# Patient Record
Sex: Female | Born: 1985 | Race: White | Hispanic: No | Smoking: Current every day smoker
Health system: Southern US, Community
[De-identification: ages and names within clinical notes are randomized; demographics above are authoritative.]

## PROBLEM LIST (undated history)

## (undated) DIAGNOSIS — Q519 Congenital malformation of uterus and cervix, unspecified: Secondary | ICD-10-CM

## (undated) DIAGNOSIS — J302 Other seasonal allergic rhinitis: Secondary | ICD-10-CM

## (undated) DIAGNOSIS — K219 Gastro-esophageal reflux disease without esophagitis: Secondary | ICD-10-CM

## (undated) DIAGNOSIS — D497 Neoplasm of unspecified behavior of endocrine glands and other parts of nervous system: Secondary | ICD-10-CM

## (undated) HISTORY — PX: HYSTEROSCOPY WITH D & C: SHX1775

## (undated) HISTORY — PX: BRAIN SURGERY: SHX531

## (undated) HISTORY — PX: WISDOM TOOTH EXTRACTION: SHX21

## (undated) HISTORY — DX: Neoplasm of unspecified behavior of endocrine glands and other parts of nervous system: D49.7

## (undated) HISTORY — PX: HYSTEROSCOPY: SHX211

---
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
## 2004-02-28 ENCOUNTER — Inpatient Hospital Stay (HOSPITAL_COMMUNITY): Admission: AD | Admit: 2004-02-28 | Discharge: 2004-02-28 | Payer: Self-pay | Admitting: Gynecology

## 2005-07-21 ENCOUNTER — Other Ambulatory Visit: Admission: RE | Admit: 2005-07-21 | Discharge: 2005-07-21 | Payer: Self-pay | Admitting: Obstetrics and Gynecology

## 2005-12-05 ENCOUNTER — Inpatient Hospital Stay (HOSPITAL_COMMUNITY): Admission: AD | Admit: 2005-12-05 | Discharge: 2005-12-05 | Payer: Self-pay | Admitting: Obstetrics and Gynecology

## 2005-12-06 ENCOUNTER — Inpatient Hospital Stay (HOSPITAL_COMMUNITY): Admission: AD | Admit: 2005-12-06 | Discharge: 2005-12-06 | Payer: Self-pay | Admitting: Obstetrics and Gynecology

## 2005-12-23 ENCOUNTER — Inpatient Hospital Stay (HOSPITAL_COMMUNITY): Admission: AD | Admit: 2005-12-23 | Discharge: 2005-12-23 | Payer: Self-pay | Admitting: Obstetrics and Gynecology

## 2005-12-25 ENCOUNTER — Encounter: Admission: RE | Admit: 2005-12-25 | Discharge: 2005-12-25 | Payer: Self-pay | Admitting: Obstetrics and Gynecology

## 2006-01-08 ENCOUNTER — Inpatient Hospital Stay (HOSPITAL_COMMUNITY): Admission: AD | Admit: 2006-01-08 | Discharge: 2006-01-08 | Payer: Self-pay | Admitting: Obstetrics and Gynecology

## 2006-01-22 ENCOUNTER — Inpatient Hospital Stay (HOSPITAL_COMMUNITY): Admission: AD | Admit: 2006-01-22 | Discharge: 2006-01-24 | Payer: Self-pay | Admitting: Obstetrics and Gynecology

## 2013-05-19 ENCOUNTER — Other Ambulatory Visit: Payer: Self-pay | Admitting: Obstetrics and Gynecology

## 2013-05-19 DIAGNOSIS — Q51810 Arcuate uterus: Secondary | ICD-10-CM

## 2013-05-22 ENCOUNTER — Other Ambulatory Visit: Payer: Self-pay

## 2013-05-25 ENCOUNTER — Other Ambulatory Visit: Payer: Self-pay

## 2013-05-29 ENCOUNTER — Ambulatory Visit
Admission: RE | Admit: 2013-05-29 | Discharge: 2013-05-29 | Disposition: A | Discharge status: Home / Self Care | Payer: BC Managed Care – PPO | Source: Ambulatory Visit | Attending: Obstetrics and Gynecology | Admitting: Obstetrics and Gynecology

## 2013-05-29 ENCOUNTER — Other Ambulatory Visit: Payer: Self-pay

## 2013-05-29 DIAGNOSIS — Q51810 Arcuate uterus: Secondary | ICD-10-CM

## 2013-05-29 MED ORDER — GADOBENATE DIMEGLUMINE 529 MG/ML IV SOLN
15.0000 mL | Freq: Once | INTRAVENOUS | Status: AC | PRN
  Administered 2013-05-29: 15 mL via INTRAVENOUS

## 2013-06-22 ENCOUNTER — Other Ambulatory Visit: Payer: Self-pay

## 2013-08-27 ENCOUNTER — Encounter (HOSPITAL_BASED_OUTPATIENT_CLINIC_OR_DEPARTMENT_OTHER): Payer: Self-pay | Admitting: *Deleted

## 2013-08-28 ENCOUNTER — Encounter (HOSPITAL_BASED_OUTPATIENT_CLINIC_OR_DEPARTMENT_OTHER): Payer: Self-pay | Admitting: *Deleted

## 2013-08-28 NOTE — Progress Notes (Signed)
NPO AFTER MN. ARRIVE AT 0915.  NEEDS HG AND URINE PREG. WILL TAKE ZANTAC AM DOS W/ SIPS OF WATER.

## 2013-09-03 ENCOUNTER — Ambulatory Visit (HOSPITAL_BASED_OUTPATIENT_CLINIC_OR_DEPARTMENT_OTHER)
Admission: RE | Admit: 2013-09-03 | Discharge: 2013-09-03 | Disposition: A | Discharge status: Home / Self Care | Payer: BC Managed Care – PPO | Source: Ambulatory Visit | Attending: Obstetrics and Gynecology | Admitting: Obstetrics and Gynecology

## 2013-09-03 ENCOUNTER — Encounter (HOSPITAL_BASED_OUTPATIENT_CLINIC_OR_DEPARTMENT_OTHER): Payer: Self-pay

## 2013-09-03 ENCOUNTER — Encounter (HOSPITAL_BASED_OUTPATIENT_CLINIC_OR_DEPARTMENT_OTHER)
Admission: RE | Disposition: A | Discharge status: Home / Self Care | Payer: Self-pay | Source: Ambulatory Visit | Attending: Obstetrics and Gynecology

## 2013-09-03 ENCOUNTER — Ambulatory Visit (HOSPITAL_BASED_OUTPATIENT_CLINIC_OR_DEPARTMENT_OTHER): Payer: BC Managed Care – PPO | Admitting: Anesthesiology

## 2013-09-03 ENCOUNTER — Encounter (HOSPITAL_BASED_OUTPATIENT_CLINIC_OR_DEPARTMENT_OTHER): Payer: BC Managed Care – PPO | Admitting: Anesthesiology

## 2013-09-03 DIAGNOSIS — Q512 Other doubling of uterus, unspecified: Principal | ICD-10-CM

## 2013-09-03 DIAGNOSIS — Q5128 Other doubling of uterus, other specified: Secondary | ICD-10-CM | POA: Insufficient documentation

## 2013-09-03 DIAGNOSIS — K219 Gastro-esophageal reflux disease without esophagitis: Secondary | ICD-10-CM | POA: Insufficient documentation

## 2013-09-03 DIAGNOSIS — F17200 Nicotine dependence, unspecified, uncomplicated: Secondary | ICD-10-CM | POA: Insufficient documentation

## 2013-09-03 HISTORY — PX: HYSTEROSCOPY: SHX211

## 2013-09-03 HISTORY — DX: Congenital malformation of uterus and cervix, unspecified: Q51.9

## 2013-09-03 HISTORY — DX: Gastro-esophageal reflux disease without esophagitis: K21.9

## 2013-09-03 HISTORY — DX: Other seasonal allergic rhinitis: J30.2

## 2013-09-03 LAB — POCT PREGNANCY, URINE: Preg Test, Ur: NEGATIVE

## 2013-09-03 LAB — POCT HEMOGLOBIN-HEMACUE: Hemoglobin: 14.2 g/dL (ref 12.0–15.0)

## 2013-09-03 SURGERY — HYSTEROSCOPY
Anesthesia: General | Site: Vagina

## 2013-09-03 MED ORDER — ONDANSETRON HCL 4 MG/2ML IJ SOLN
INTRAMUSCULAR | Status: DC | PRN
  Administered 2013-09-03: 4 mg via INTRAVENOUS

## 2013-09-03 MED ORDER — FENTANYL CITRATE 0.05 MG/ML IJ SOLN
100.0000 ug | Freq: Once | INTRAMUSCULAR | Status: AC
  Administered 2013-09-03 (×2): 50 ug via INTRAVENOUS
  Filled 2013-09-03: qty 2

## 2013-09-03 MED ORDER — GLYCOPYRROLATE 0.2 MG/ML IJ SOLN
INTRAMUSCULAR | Status: DC | PRN
  Administered 2013-09-03: 0.2 mg via INTRAVENOUS

## 2013-09-03 MED ORDER — MIDAZOLAM HCL 5 MG/5ML IJ SOLN
INTRAMUSCULAR | Status: DC | PRN
  Administered 2013-09-03 (×2): 1 mg via INTRAVENOUS

## 2013-09-03 MED ORDER — SODIUM CHLORIDE 0.9 % IV SOLN
INTRAVENOUS | Status: DC | PRN
  Administered 2013-09-03: 13:00:00 via INTRAMUSCULAR

## 2013-09-03 MED ORDER — LACTATED RINGERS IV SOLN
INTRAVENOUS | Status: DC | PRN
  Administered 2013-09-03 (×2): via INTRAVENOUS

## 2013-09-03 MED ORDER — FENTANYL CITRATE 0.05 MG/ML IJ SOLN
25.0000 ug | INTRAMUSCULAR | Status: DC | PRN
  Filled 2013-09-03: qty 1

## 2013-09-03 MED ORDER — ONDANSETRON HCL 4 MG/2ML IJ SOLN: INTRAMUSCULAR | Status: DC | PRN

## 2013-09-03 MED ORDER — ACETAMINOPHEN 10 MG/ML IV SOLN
INTRAVENOUS | Status: DC | PRN
  Administered 2013-09-03: 1000 mg via INTRAVENOUS

## 2013-09-03 MED ORDER — KETOROLAC TROMETHAMINE 30 MG/ML IJ SOLN
INTRAMUSCULAR | Status: DC | PRN
  Administered 2013-09-03: 30 mg via INTRAVENOUS

## 2013-09-03 MED ORDER — CEFAZOLIN SODIUM-DEXTROSE 2-3 GM-% IV SOLR
2.0000 g | Freq: Once | INTRAVENOUS | Status: AC
  Administered 2013-09-03: 2 g via INTRAVENOUS
  Filled 2013-09-03: qty 50

## 2013-09-03 MED ORDER — FENTANYL CITRATE 0.05 MG/ML IJ SOLN
INTRAMUSCULAR | Status: AC
  Filled 2013-09-03: qty 4

## 2013-09-03 MED ORDER — DEXAMETHASONE SODIUM PHOSPHATE 4 MG/ML IJ SOLN
INTRAMUSCULAR | Status: DC | PRN
  Administered 2013-09-03: 10 mg via INTRAVENOUS

## 2013-09-03 MED ORDER — METOCLOPRAMIDE HCL 5 MG/ML IJ SOLN
INTRAMUSCULAR | Status: DC | PRN
  Administered 2013-09-03: 10 mg via INTRAVENOUS

## 2013-09-03 MED ORDER — LACTATED RINGERS IV SOLN
INTRAVENOUS | Status: DC
  Administered 2013-09-03 (×2): via INTRAVENOUS
  Filled 2013-09-03: qty 1000

## 2013-09-03 MED ORDER — LIDOCAINE HCL (CARDIAC) 20 MG/ML IV SOLN
INTRAVENOUS | Status: DC | PRN
  Administered 2013-09-03: 60 mg via INTRAVENOUS

## 2013-09-03 MED ORDER — LACTATED RINGERS IV SOLN
INTRAVENOUS | Status: DC
  Filled 2013-09-03: qty 1000

## 2013-09-03 MED ORDER — FENTANYL CITRATE 0.05 MG/ML IJ SOLN
INTRAMUSCULAR | Status: DC | PRN
  Administered 2013-09-03: 12.5 ug via INTRAVENOUS
  Administered 2013-09-03: 25 ug via INTRAVENOUS
  Administered 2013-09-03: 12.5 ug via INTRAVENOUS
  Administered 2013-09-03: 25 ug via INTRAVENOUS
  Administered 2013-09-03: 12.5 ug via INTRAVENOUS
  Administered 2013-09-03 (×2): 25 ug via INTRAVENOUS
  Administered 2013-09-03: 12.5 ug via INTRAVENOUS
  Administered 2013-09-03 (×2): 25 ug via INTRAVENOUS

## 2013-09-03 MED ORDER — HEMOSTATIC AGENTS (NO CHARGE) OPTIME
TOPICAL | Status: DC | PRN
  Administered 2013-09-03: 1 via TOPICAL

## 2013-09-03 MED ORDER — MIDAZOLAM HCL 2 MG/2ML IJ SOLN
INTRAMUSCULAR | Status: AC
  Filled 2013-09-03: qty 2

## 2013-09-03 MED ORDER — FENTANYL CITRATE 0.05 MG/ML IJ SOLN
INTRAMUSCULAR | Status: AC
  Filled 2013-09-03: qty 2

## 2013-09-03 MED ORDER — PROPOFOL 10 MG/ML IV BOLUS
INTRAVENOUS | Status: DC | PRN
Start: 2013-09-03 — End: 2013-09-03
  Administered 2013-09-03: 200 mg via INTRAVENOUS

## 2013-09-03 MED ORDER — SODIUM CHLORIDE 0.9 % IR SOLN
Status: DC | PRN
Start: 2013-09-03 — End: 2013-09-03
  Administered 2013-09-03: 3000 mL

## 2013-09-03 SURGICAL SUPPLY — 50 items
CANISTER SUCTION 2500CC (MISCELLANEOUS) ×3 IMPLANT
CANNULA CURETTE W/SYR 6 (CANNULA) ×1 IMPLANT
CANNULA CURETTE W/SYR 6MM (CANNULA) ×1
CATH ROBINSON RED A/P 16FR (CATHETERS) ×1 IMPLANT
CORD ACTIVE DISPOSABLE (ELECTRODE)
CORD ELECTRO ACTIVE DISP (ELECTRODE) IMPLANT
COVER TABLE BACK 60X90 (DRAPES) ×3 IMPLANT
DRAPE CAMERA CLOSED 9X96 (DRAPES) ×3 IMPLANT
DRAPE HYSTEROSCOPY (DRAPE) ×3 IMPLANT
DRAPE LG THREE QUARTER DISP (DRAPES) ×6 IMPLANT
DRESSING TELFA 8X3 (GAUZE/BANDAGES/DRESSINGS) ×3 IMPLANT
ELECT LOOP GYNE PRO 24FR (CUTTING LOOP)
ELECT REM PT RETURN 9FT ADLT (ELECTROSURGICAL)
ELECT VAPORTRODE GRVD BAR (ELECTRODE) IMPLANT
ELECTRODE LOOP GYNE PRO 24FR (CUTTING LOOP) IMPLANT
ELECTRODE REM PT RTRN 9FT ADLT (ELECTROSURGICAL) IMPLANT
ELECTRODE ROLLER VERSAPOINT (ELECTRODE) IMPLANT
ELECTRODE RT ANGLE VERSAPOINT (CUTTING LOOP) IMPLANT
GLOVE BIO SURGEON STRL SZ 6.5 (GLOVE) ×1 IMPLANT
GLOVE BIO SURGEON STRL SZ8 (GLOVE) ×3 IMPLANT
GLOVE BIO SURGEONS STRL SZ 6.5 (GLOVE) ×1
GLOVE BIOGEL PI IND STRL 6.5 (GLOVE) IMPLANT
GLOVE BIOGEL PI IND STRL 8.5 (GLOVE) ×1 IMPLANT
GLOVE BIOGEL PI INDICATOR 6.5 (GLOVE) ×2
GLOVE BIOGEL PI INDICATOR 8.5 (GLOVE) ×2
GOWN STRL REIN XL XLG (GOWN DISPOSABLE) ×2 IMPLANT
GOWN STRL REUS W/TWL LRG LVL3 (GOWN DISPOSABLE) ×2 IMPLANT
GOWN STRL REUS W/TWL XL LVL3 (GOWN DISPOSABLE) ×2 IMPLANT
IV NS IRRIG 3000ML ARTHROMATIC (IV SOLUTION) ×2 IMPLANT
LEGGING LITHOTOMY PAIR STRL (DRAPES) ×3 IMPLANT
LOOP ANGLED CUTTING 22FR (CUTTING LOOP) IMPLANT
NDL SPNL 22GX3.5 QUINCKE BK (NEEDLE) ×1 IMPLANT
NEEDLE SPNL 22GX3.5 QUINCKE BK (NEEDLE) ×3 IMPLANT
PACK BASIN DAY SURGERY FS (CUSTOM PROCEDURE TRAY) ×3 IMPLANT
PAD OB MATERNITY 4.3X12.25 (PERSONAL CARE ITEMS) ×3 IMPLANT
SET IRRIG Y TYPE TUR BLADDER L (SET/KITS/TRAYS/PACK) IMPLANT
SET TUBING HYSTEROSCOPY 2 NDL (TUBING) ×3 IMPLANT
STENT BALLN UTERINE 3CM 6FR (Stent) IMPLANT
STENT BALLN UTERINE 4CM 6FR (STENTS) IMPLANT
SUT MON AB 3-0 SH 27 (SUTURE) ×2
SUT MON AB 3-0 SH27 (SUTURE) IMPLANT
SUT SILK 2 0 SH (SUTURE) IMPLANT
SUT SILK 3 0 PS 1 (SUTURE) IMPLANT
SYR 20CC LL (SYRINGE) IMPLANT
SYR 3ML 18GX1 1/2 (SYRINGE) ×3 IMPLANT
SYR CONTROL 10ML LL (SYRINGE) ×3 IMPLANT
TOWEL OR 17X24 6PK STRL BLUE (TOWEL DISPOSABLE) ×6 IMPLANT
TRAY DSU PREP LF (CUSTOM PROCEDURE TRAY) ×3 IMPLANT
TUBE HYSTEROSCOPY W Y-CONNECT (TUBING) ×3 IMPLANT
WATER STERILE IRR 500ML POUR (IV SOLUTION) ×3 IMPLANT

## 2013-09-03 NOTE — Op Note (Signed)
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 The uterus sounded to 7.5 cm. it had a partial septum extending down 1.5 cm. No submucosal myoma was noted.  Both tubal ostia were seen. There was a membraneous cover with a central opening caudal to the left tubal ostium. Description of procedure: Patient was placed in dorsal supine position. General anesthesia was administered. She was placed in lithotomy position. She was prepped and draped in sterile manner. A vaginal speculum was placed. A dilute vasopressin solution containing 0.33 units per milliliter was injected into the cervical stroma x5 cc. A Slimline hysteroscope with 15 lens was inserted into the canal and above findings were noted. Distention medium was normal saline. Distention method was a hysteroscopic pump set at 80 mm mercury. Above findings were noted.  Using hysteroscopic scissors the septum was incised up to the level of an imaginary line drawn, connecting the 2 tubal ostia. Using a sharp curet endometrium was curetted and the specimen was sent to pathology. As an adhesion barrier, a large sheet of Seprafilm was rolled around smooth DeBakey forceps. The cervix was dilated to 64 [place] with [employer] dilators. The Seprafilm roll was inserted up to the fundus and was trimmed off at 7.5 cm. It was temporarily affixed to the ectocervical mucosa with a single suture of 4-0 Monocryl.  Hemostasis was insured. Instrument count was correct. Estimated blood loss was less than 20 mL. The patient tolerated the procedure  well and was transferred to recovery in satisfactory condition.  Governor Specking

## 2013-09-03 NOTE — Anesthesia Preprocedure Evaluation (Addendum)
Anesthesia Evaluation  Patient identified by MRN, date of birth, ID band Patient awake    Reviewed: Allergy & Precautions, H&P , NPO status , Patient's Chart, lab work & pertinent test results  Airway Mallampati: II TM Distance: >3 FB Neck ROM: full    Dental no notable dental hx. (+) Teeth Intact, Dental Advisory Given   Pulmonary neg pulmonary ROS, Current Smoker,  breath sounds clear to auscultation  Pulmonary exam normal       Cardiovascular Exercise Tolerance: Good negative cardio ROS  Rhythm:regular Rate:Normal     Neuro/Psych negative neurological ROS  negative psych ROS   GI/Hepatic negative GI ROS, Neg liver ROS, GERD-  Medicated and Controlled,  Endo/Other  negative endocrine ROS  Renal/GU negative Renal ROS  negative genitourinary   Musculoskeletal   Abdominal   Peds  Hematology negative hematology ROS (+)   Anesthesia Other Findings   Reproductive/Obstetrics negative OB ROS                          Anesthesia Physical Anesthesia Plan  ASA: II  Anesthesia Plan: General   Post-op Pain Management:    Induction: Intravenous  Airway Management Planned: LMA  Additional Equipment:   Intra-op Plan:   Post-operative Plan:   Informed Consent: I have reviewed the patients History and Physical, chart, labs and discussed the procedure including the risks, benefits and alternatives for the proposed anesthesia with the patient or authorized representative who has indicated his/her understanding and acceptance.   Dental Advisory Given  Plan Discussed with: CRNA and Surgeon  Anesthesia Plan Comments:         Anesthesia Quick Evaluation

## 2013-09-03 NOTE — Discharge Instructions (Addendum)
°  Post Anesthesia Home Care Instructions ° °Activity: °Get plenty of rest for the remainder of the day. A responsible adult should stay with you for 24 hours following the procedure.  °For the next 24 hours, DO NOT: °-Drive a car °-Operate machinery °-Drink alcoholic beverages °-Take any medication unless instructed by your physician °-Make any legal decisions or sign important papers. ° °Meals: °Start with liquid foods such as gelatin or soup. Progress to regular foods as tolerated. Avoid greasy, spicy, heavy foods. If nausea and/or vomiting occur, drink only clear liquids until the nausea and/or vomiting subsides. Call your physician if vomiting continues. ° °Special Instructions/Symptoms: °Your throat may feel dry or sore from the anesthesia or the breathing tube placed in your throat during surgery. If this causes discomfort, gargle with warm salt water. The discomfort should disappear within 24 hours. ° ° Home care Instructions: ° ° °Personal hygiene:  Used sanitary napkins for vaginal drainage not tampons. Shower or tub bathe the day after your procedure. No douching until bleeding stops. Always wipe from front to back after  Elimination. ° °Activity: Do not drive or operate any equipment today. The effects of the anesthesia are still present and drowsiness may result. Rest today, not necessarily flat bed rest, just take it easy. You may resume your normal activity in one to 2 days. ° °Sexual activity: No intercourse for one week or as indicated by your physician ° °Diet: Eat a light diet as desired this evening. You may resume a regular diet tomorrow. ° °Return to work: One to 2 days. ° °General Expectations of your surgery: Vaginal bleeding should be no heavier than a normal period. Spotting may continue up to 10 days. Mild cramps may continue for a couple of days. You may have a regular period in 2-6 weeks. ° °Unexpected observations call your doctor if these occur: persistent or heavy bleeding. Severe  abdominal cramping or pain. Elevation of temperature greater than 100°F. ° °Call for an appointment in one week. ° ° ° °Patient's Signature_______________________________________________________ ° °Nurse's Signature________________________________________________________ °

## 2013-09-03 NOTE — Anesthesia Procedure Notes (Signed)
Procedure Name: LMA Insertion Date/Time: 09/03/2013 1:04 PM Performed by: Justice Rocher Pre-anesthesia Checklist: Patient identified, Emergency Drugs available, Suction available and Patient being monitored Patient Re-evaluated:Patient Re-evaluated prior to inductionOxygen Delivery Method: Circle System Utilized Preoxygenation: Pre-oxygenation with 100% oxygen Intubation Type: IV induction Ventilation: Mask ventilation without difficulty LMA: LMA inserted LMA Size: 4.0 Number of attempts: 1 Airway Equipment and Method: bite block Placement Confirmation: positive ETCO2 Tube secured with: Tape Dental Injury: Teeth and Oropharynx as per pre-operative assessment

## 2013-09-03 NOTE — Transfer of Care (Signed)
Immediate Anesthesia Transfer of Care Note  Patient: Sierra Stewart  Procedure(s) Performed: Procedure(s) (LRB): HYSTEROSCOPY, EXCISION OF UTRINE SEPTUM, suction d&c, stent placement with septrafilm (N/A)  Patient Location: PACU  Anesthesia Type: General  Level of Consciousness: awake, sedated, patient cooperative and responds to stimulation  Airway & Oxygen Therapy: Patient Spontanous Breathing and Patient connected to face mask oxygen  Post-op Assessment: Report given to PACU RN, Post -op Vital signs reviewed and stable and Patient moving all extremities  Post vital signs: Reviewed and stable  Complications: No apparent anesthesia complications

## 2013-09-03 NOTE — H&P (Signed)
Sierra Stewart is a 28 y.o. female G:2 P:1011, originally referred to me by Dr. Charlesetta Garibaldi, for partial uterine septum and small submucosal myoma.  Patient would like to preserve her childbearing potential.  Pertinent Gynecological History: Menses: flow is excessive with use of 3 pads or tampons on heaviest days Contraception: none DES exposure: denies Blood transfusions: none Sexually transmitted diseases: no past history  Last mammogram: normal Last pap: normal  OB History: ! SVD and 1 EAb   Menstrual History: Menarche age: 84 No LMP recorded.    Past Medical History  Diagnosis Date  . Uterine anomaly     SEPTUM  . GERD (gastroesophageal reflux disease)   . Seasonal allergies                     Past Surgical History  Procedure Laterality Date  . Hysteroscopy w/d&c  AGE 57  . Wisdom tooth extraction  AGE 32             History reviewed. No pertinent family history. No hereditary disease.  No cancer of breast, ovary, uterus. No cutaneous leiomyomatosis or renal cell carcinoma.  History   Social History  . Marital Status: Single    Spouse Name: N/A    Number of Children: N/A  . Years of Education: N/A   Occupational History  . Not on file.   Social History Main Topics  . Smoking status: Current Every Day Smoker -- 1.00 packs/day for 15 years    Types: Cigarettes  . Smokeless tobacco: Never Used  . Alcohol Use: 2.4 oz/week    4 Cans of beer per week  . Drug Use: No  . Sexual Activity: Not on file   Other Topics Concern  . Not on file   Social History Narrative  . No narrative on file    Allergies  Allergen Reactions  . Imitrex [Sumatriptan] Shortness Of Breath and Swelling    No current facility-administered medications on file prior to encounter.   No current outpatient prescriptions on file prior to encounter.     Review of Systems  Constitutional: Negative.   HENT: Negative.   Eyes: Negative.   Respiratory: Negative.   Cardiovascular:  Negative.   Gastrointestinal: Negative.   Genitourinary: Negative.   Musculoskeletal: Negative.   Skin: Negative.   Neurological: Negative.   Endo/Heme/Allergies: Negative.   Psychiatric/Behavioral: Negative.      Physical Exam  BP 114/83  Pulse 97  Temp(Src) 97.2 F (36.2 C) (Oral)  Resp 16  Ht 5\' 2"  (1.575 m)  Wt 70.761 kg (156 lb)  BMI 28.53 kg/m2  SpO2 100%  LMP 08/21/2013 Constitutional: She is oriented to person, place, and time. She appears well-developed and well-nourished.  HENT:  Head: Normocephalic and atraumatic.  Nose: Nose normal.  Mouth/Throat: Oropharynx is clear and moist. No oropharyngeal exudate.  Eyes: Conjunctivae normal and EOM are normal. Pupils are equal, round, and reactive to light. No scleral icterus.  Neck: Normal range of motion. Neck supple. No tracheal deviation present. No thyromegaly present.  Cardiovascular: Normal rate.   Respiratory: Effort normal and breath sounds normal.  GI: Soft. Bowel sounds are normal. She exhibits no distension and no mass. There is no tenderness.  Lymphadenopathy:    She has no cervical adenopathy.  Neurological: She is alert and oriented to person, place, and time. She has normal reflexes.  Skin: Skin is warm.  Psychiatric: She has a normal mood and affect. Her behavior is normal. Judgment and thought  content normal.       Assessment/Plan:  Partial uterine septum Submucosal myoma Pt is scheduled for H/S , inc of septum, resection of myoma. Benefits and risks were discussed with pt.    Governor Specking

## 2013-09-03 NOTE — Anesthesia Postprocedure Evaluation (Signed)
  Anesthesia Post-op Note  Patient: Sierra Stewart  Procedure(s) Performed: Procedure(s) (LRB): HYSTEROSCOPY, EXCISION OF UTRINE SEPTUM, suction d&c, stent placement with septrafilm (N/A)  Patient Location: PACU  Anesthesia Type: General  Level of Consciousness: awake and alert   Airway and Oxygen Therapy: Patient Spontanous Breathing  Post-op Pain: mild  Post-op Assessment: Post-op Vital signs reviewed, Patient's Cardiovascular Status Stable, Respiratory Function Stable, Patent Airway and No signs of Nausea or vomiting  Last Vitals:  Filed Vitals:   09/03/13 1351  BP: 107/75  Pulse: 86  Temp:   Resp: 12    Post-op Vital Signs: stable   Complications: No apparent anesthesia complications

## 2013-09-04 ENCOUNTER — Encounter (HOSPITAL_BASED_OUTPATIENT_CLINIC_OR_DEPARTMENT_OTHER): Payer: Self-pay | Admitting: Obstetrics and Gynecology

## 2016-03-27 HISTORY — PX: BRAIN SURGERY: SHX531

## 2016-04-19 ENCOUNTER — Other Ambulatory Visit: Payer: Self-pay | Admitting: Internal Medicine

## 2016-04-19 DIAGNOSIS — H53459 Other localized visual field defect, unspecified eye: Secondary | ICD-10-CM

## 2016-04-22 ENCOUNTER — Ambulatory Visit
Admission: RE | Admit: 2016-04-22 | Discharge: 2016-04-22 | Disposition: A | Discharge status: Home / Self Care | Payer: Self-pay | Source: Ambulatory Visit | Attending: Internal Medicine | Admitting: Internal Medicine

## 2016-04-22 DIAGNOSIS — H53459 Other localized visual field defect, unspecified eye: Secondary | ICD-10-CM

## 2016-04-22 MED ORDER — GADOBENATE DIMEGLUMINE 529 MG/ML IV SOLN
17.0000 mL | Freq: Once | INTRAVENOUS | Status: AC | PRN
  Administered 2016-04-22: 17 mL via INTRAVENOUS

## 2016-05-03 ENCOUNTER — Ambulatory Visit (INDEPENDENT_AMBULATORY_CARE_PROVIDER_SITE_OTHER): Payer: BLUE CROSS/BLUE SHIELD | Admitting: Neurology

## 2016-05-03 ENCOUNTER — Encounter: Payer: Self-pay | Admitting: Neurology

## 2016-05-03 ENCOUNTER — Telehealth: Payer: Self-pay | Admitting: Neurology

## 2016-05-03 VITALS — BP 146/83 | HR 102 | Resp 20 | Ht 63.0 in | Wt 177.0 lb

## 2016-05-03 DIAGNOSIS — G43909 Migraine, unspecified, not intractable, without status migrainosus: Secondary | ICD-10-CM | POA: Insufficient documentation

## 2016-05-03 DIAGNOSIS — G43009 Migraine without aura, not intractable, without status migrainosus: Secondary | ICD-10-CM | POA: Diagnosis not present

## 2016-05-03 DIAGNOSIS — R419 Unspecified symptoms and signs involving cognitive functions and awareness: Secondary | ICD-10-CM

## 2016-05-03 DIAGNOSIS — H539 Unspecified visual disturbance: Secondary | ICD-10-CM | POA: Insufficient documentation

## 2016-05-03 DIAGNOSIS — R55 Syncope and collapse: Secondary | ICD-10-CM | POA: Diagnosis not present

## 2016-05-03 DIAGNOSIS — H53453 Other localized visual field defect, bilateral: Secondary | ICD-10-CM

## 2016-05-03 MED ORDER — NORTRIPTYLINE HCL 10 MG PO CAPS: 10.0000 mg | ORAL_CAPSULE | Freq: Every day | ORAL | 6 refills | Status: DC

## 2016-05-03 NOTE — Telephone Encounter (Signed)
Sierra Stewart, would you get an [facility] form ready fpor this patient please?  Headache, migraine, syncopal episodes, musculoskeletal neck pain: Integrative therapy for acupuncture, massage, PT and boifeedback

## 2016-05-03 NOTE — Patient Instructions (Addendum)
Remember to drink plenty of fluid, eat healthy meals and do not skip any meals. Try to eat protein with a every meal and eat a healthy snack such as fruit or nuts in between meals. Try to keep a regular sleep-wake schedule and try to exercise daily, particularly in the form of walking, 20-30 minutes a day, if you can.   As far as your medications are concerned, I would like to suggest: Nortriptyline at bedtime  As far as diagnostic testing: EEG, Dr Katy Fitch then possible a 3-day home eeg  Our phone number is [phone]. We also have an after hours call service for urgent matters and there is a physician on-call for urgent questions. For any emergencies you know to call 911 or go to the nearest emergency room  Nortriptyline capsules What is this medicine? NORTRIPTYLINE (nor TRIP ti leen) is used to treat migraines. This medicine may be used for other purposes; ask your health care provider or pharmacist if you have questions. COMMON BRAND NAME(S): Aventyl, Pamelor What should I tell my health care provider before I take this medicine? They need to know if you have any of these conditions: -an alcohol problem -bipolar disorder or schizophrenia -difficulty passing urine, prostate trouble -glaucoma -heart disease or recent heart attack -liver disease -over active thyroid -seizures -thoughts or plans of suicide or a previous suicide attempt or family history of suicide attempt -an unusual or allergic reaction to nortriptyline, other medicines, foods, dyes, or preservatives -pregnant or trying to get pregnant -breast-feeding How should I use this medicine? Take this medicine by mouth with a glass of water. Follow the directions on the prescription label. Take your doses at regular intervals. Do not take it more often than directed. Do not stop taking this medicine suddenly except upon the advice of your doctor. Stopping this medicine too quickly may cause serious side effects or your condition  may worsen. A special MedGuide will be given to you by the pharmacist with each prescription and refill. Be sure to read this information carefully each time. Talk to your pediatrician regarding the use of this medicine in children. Special care may be needed. Overdosage: If you think you have taken too much of this medicine contact a poison control center or emergency room at once. NOTE: This medicine is only for you. Do not share this medicine with others. What if I miss a dose? If you miss a dose, take it as soon as you can. If it is almost time for your next dose, take only that dose. Do not take double or extra doses. What may interact with this medicine? Do not take this medicine with any of the following medications: -arsenic trioxide -certain medicines medicines for irregular heart beat -cisapride -halofantrine -linezolid -MAOIs like Carbex, Eldepryl, Marplan, Nardil, and Parnate -methylene blue (injected into a vein) -other medicines for mental depression -phenothiazines like perphenazine, thioridazine and chlorpromazine -pimozide -probucol -procarbazine -sparfloxacin -St. John's Wort -ziprasidone This medicine may also interact with any of the following medications: -atropine and related drugs like hyoscyamine, scopolamine, tolterodine and others -barbiturate medicines for inducing sleep or treating seizures, such as phenobarbital -cimetidine -medicines for diabetes -medicines for seizures like carbamazepine or phenytoin -reserpine -thyroid medicine This list may not describe all possible interactions. Give your health care provider a list of all the medicines, herbs, non-prescription drugs, or dietary supplements you use. Also tell them if you smoke, drink alcohol, or use illegal drugs. Some items may interact with your medicine. What should I  watch for while using this medicine? Tell your doctor if your symptoms do not get better or if they get worse. Visit your doctor  or health care professional for regular checks on your progress. Because it may take several weeks to see the full effects of this medicine, it is important to continue your treatment as prescribed by your doctor. Patients and their families should watch out for new or worsening thoughts of suicide or depression. Also watch out for sudden changes in feelings such as feeling anxious, agitated, panicky, irritable, hostile, aggressive, impulsive, severely restless, overly excited and hyperactive, or not being able to sleep. If this happens, especially at the beginning of treatment or after a change in dose, call your health care professional. Dennis Bast may get drowsy or dizzy. Do not drive, use machinery, or do anything that needs mental alertness until you know how this medicine affects you. Do not stand or sit up quickly, especially if you are an older patient. This reduces the risk of dizzy or fainting spells. Alcohol may interfere with the effect of this medicine. Avoid alcoholic drinks. Do not treat yourself for coughs, colds, or allergies without asking your doctor or health care professional for advice. Some ingredients can increase possible side effects. Your mouth may get dry. Chewing sugarless gum or sucking hard candy, and drinking plenty of water may help. Contact your doctor if the problem does not go away or is severe. This medicine may cause dry eyes and blurred vision. If you wear contact lenses you may feel some discomfort. Lubricating drops may help. See your eye doctor if the problem does not go away or is severe. This medicine can cause constipation. Try to have a bowel movement at least every 2 to 3 days. If you do not have a bowel movement for 3 days, call your doctor or health care professional. This medicine can make you more sensitive to the sun. Keep out of the sun. If you cannot avoid being in the sun, wear protective clothing and use sunscreen. Do not use sun lamps or tanning  beds/booths. What side effects may I notice from receiving this medicine? Side effects that you should report to your doctor or health care professional as soon as possible: -allergic reactions like skin rash, itching or hives, swelling of the face, lips, or tongue -anxious -breathing problems -changes in vision -confusion -elevated mood, decreased need for sleep, racing thoughts, impulsive behavior -eye pain -fast, irregular heartbeat -feeling faint or lightheaded, falls -feeling agitated, angry, or irritable -fever with increased sweating -hallucination, loss of contact with reality -seizures -stiff muscles -suicidal thoughts or other mood changes -tingling, pain, or numbness in the feet or hands -trouble passing urine or change in the amount of urine -trouble sleeping -unusually weak or tired -vomiting -yellowing of the eyes or skin Side effects that usually do not require medical attention (report to your doctor or health care professional if they continue or are bothersome): -change in sex drive or performance -change in appetite or weight -constipation -dizziness -dry mouth -nausea -tired -tremors -upset stomach This list may not describe all possible side effects. Call your doctor for medical advice about side effects. You may report side effects to FDA at 1-800-FDA-1088. Where should I keep my medicine? Keep out of the reach of children. Store at room temperature between 15 and 30 degrees C (59 and 86 degrees F). Keep container tightly closed. Throw away any unused medicine after the expiration date. NOTE: This sheet is a summary. It  may not cover all possible information. If you have questions about this medicine, talk to your doctor, pharmacist, or health care provider.  2017 Elsevier/Gold Standard (2015-08-13 12:53:08)   Vasovagal Syncope, Adult Syncope, which is commonly known as fainting or passing out, is a temporary loss of consciousness. It occurs when the  blood flow to the brain is reduced. Vasovagal syncope, also called neurocardiogenic syncope, is a fainting spell that happens when blood flow to the brain is reduced because of a sudden drop in heart rate and blood pressure. Vasovagal syncope is usually harmless. However, you can get injured if you fall during a fainting spell. What are the causes? This condition is caused by a drop in heart rate and blood pressure, usually in response to a trigger. Many things and situations can trigger an episode, including:  Pain.  Fear.  The sight of blood. This may occur during medical procedures, such as when blood is being drawn from a vein.  Common activities, such as coughing, swallowing, stretching, or going to the bathroom.  Emotional stress.  Being in a confined space.  Prolonged standing, especially in a warm environment.  Lack of sleep or rest.  Not eating for a long time.  Not drinking enough liquids.  Recent illness.  Drinking alcohol.  Taking drugs that affect blood pressure, such as marijuana, cocaine, opiates, or inhalants. What are the signs or symptoms? Before a fainting episode, you may:  Feel dizzy or light-headed.  Become pale.  Sense that you are going to faint.  Feel like the room is spinning.  Only see directly ahead (tunnel vision).  Feel sick to your stomach (nauseous).  See spots.  Slowly lose vision.  Hear ringing in your ears.  Have a headache.  Feel warm and sweaty.  Feel a sensation of pins and needles. During the fainting spell, you may twitch or make jerky movements. Fainting spells usually last no longer than a few minutes before you wake up. If you get up too quickly before your body can recover, you may faint again. How is this diagnosed? This condition is diagnosed based on your symptoms, your medical history, and a physical exam. Tests may be done to rule out other causes of fainting. Tests may include:  Blood tests.  Heart tests,  such as an electrocardiogram (ECG), echocardiogram, or electrophysiology study.  A test to check your response to changes in position (tilt table test). How is this treated? Usually, treatment is not needed for this condition. Your health care provider may suggest ways to help prevent fainting episodes. These may include:  Drinking additional fluids if you are exposed to a trigger.  Sitting or lying down if you notice signs that an episode is coming. If your fainting spells continue, your health care provider may recommend that you:  Take medicines to prevent fainting or to help reduce further episodes of fainting.  Do certain exercises.  Wear compression stockings.  Have surgery to place a pacemaker in your body (rare). Follow these instructions at home:  Learn to identify the signs that an episode is coming.  Sit or lie down at the first sign of a fainting spell. If you sit down, put your head down between your legs. If you lie down, swing your legs up in the air to increase blood flow to the brain.  Avoid hot tubs and saunas.  Avoid standing for a long time. If you have to stand for a long time, try:  Crossing your legs.  Flexing and stretching your leg muscles.  Squatting.  Moving your legs.  Bending over.  Drink enough fluid to keep your urine clear or pale yellow.  Make changes to your diet that your health care provider recommends. You may be told to:  Avoid caffeine.  Eat more salt.  Take over-the-counter and prescription medicines only as told by your health care provider. Contact a health care provider if:  You continue to have fainting spells despite treatment.  You faint more often despite treatment.  You lose consciousness for more than a few minutes.  You faint during or after exercising or after being startled.  You have twitching or jerky movements for longer than a few seconds during a fainting spell.  You have an episode of twitching or  jerky movements without fainting. Get help right away if:  A fainting spell leads to an injury or bleeding.  You have new symptoms that occur with the fainting spells, such as:  Shortness of breath.  Chest pain.  Irregular heartbeat.  You twitch or make jerky movements for more than 5 minutes.  You twitch or make jerky movements during more than one fainting spell. This information is not intended to replace advice given to you by your health care provider. Make sure you discuss any questions you have with your health care provider. Document Released: 02/28/2012 Document Revised: 08/25/2015 Document Reviewed: 01/09/2015 Elsevier Interactive Patient Education  2017 [employer].

## 2016-05-03 NOTE — Progress Notes (Addendum)
[facility]    Provider:  Dr Jaynee Eagles Referring Provider: Osborne Casco Fransico Him, MD Primary Care Physician:  Domenick Gong  CC:  Syncope, headache, visual field defect  HPI:  Sierra Stewart is a 31 y.o. female here as a referral from Dr. Osborne Casco for Syncope and headache and vision changes. Symptoms started 2 years ago, they went to a [employer] truck show and the exhaust bothered her, she started to feel nauseated and lightheaded and she could feel herself start to pass out, her vision was getting grey, she "went out". The next thing that she remembers she was in [place] and she had defecated on herself. She had 2 of these episodes that year. Another episode her heart started racing, her vision started with kaleidoscopes on the left side of her left eye, she can;t focus, lightheaded, world is coming in, hot and she was trying to slow her breathing down and she defecates. She also doesn't think she has had a solid stool in years. Happening more frequently. Recently she has extreme fatigue since last October. Before Christmas she had 2 episodes in one week and she saw an optometrist (an eye doctor for glasses) and they found a field defect on 2 occassions but they were different and she does not know where. Since then she is very fatigued with nausea every day and migraines every week. The last few days have been good. Her headache is a sharp pain on the right side, pounding, in the temporal area. Sometimes it is a brief sharp pain and sometimes it is a dull ache all day and nausea. This headache is different than her migraines that are on the right side, pounding and throbbing with light sensitivity and nausea once a week. No seizure-like activity with the episodes of passing out. No confusion afterwards, she knew where she was unknown how long she has lost consciousness. She has lost consciousness twice with the episodes. 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  She sleeps at  9-10pm and gets 7-8 hours of sleep and sleeps well. Her father had migraines and sister as well. No FHx of seizures. During the episodes she feels like her heart is beating out of her chest. She has decreased peripheral vision continuously.She has neck muscle pain. No other focal neurologic deficits, associated symptoms, inciting events or modifiable factors.    Reviewed notes, labs and imaging from outside physicians, which showed:   MRI brain 04/22/2016: personally reviewed images and agree with the following:  FINDINGS: Brain: Normal appearance of the chiasm and tracts. Optic pathways show no mass or gliosis. No acute or remote infarct or hemorrhage. 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  Vascular: Negative  Skull and upper cervical spine: Negative  Sinuses/Orbits: Negative  IMPRESSION: 1. No explanation for visual field defect. 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  Labs 04/10/2016: CMP nml with bun 8 creatinine 0.7, Cbc normal, TSH 0.53,    Review of Systems: Patient complains of symptoms per HPI as well as the following symptoms: fatigue, blurred vision, memory loss, headache, numbness, weakness, slepiness, joint pain, hearing loss. Pertinent negatives per HPI. All others negative.   Social History   Social History  . Marital status: Married    Spouse name: N/A  . Number of children: N/A  . Years of education: N/A   Occupational History  . dog groomer    Social History Main Topics  . Smoking status: Current Every Day Smoker  Packs/day: 1.00    Years: 15.00    Types: Cigarettes  . Smokeless tobacco: Never Used  . Alcohol use 2.4 oz/week    4 Cans of beer per week  . Drug use: No  . Sexual activity: Not on file   Other Topics Concern  . Not on file   Social History Narrative  . No narrative on file    Family History  Problem Relation Age of Onset  . Migraines Father   . Seizures Neg Hx     Past Medical History:  Diagnosis Date  . GERD  (gastroesophageal reflux disease)   . Seasonal allergies   . Uterine anomaly    SEPTUM    Past Surgical History:  Procedure Laterality Date  . HYSTEROSCOPY N/A 09/03/2013   Procedure: HYSTEROSCOPY, EXCISION OF UTRINE SEPTUM, suction d&c, stent placement with septrafilm;  Surgeon: Governor Specking, MD;  Location: [facility];  Service: Gynecology;  Laterality: N/A;  . HYSTEROSCOPY W/D&C  AGE 35  . WISDOM TOOTH EXTRACTION  AGE 42    Current Outpatient Prescriptions  Medication Sig Dispense Refill  . aspirin-acetaminophen-caffeine (EXCEDRIN MIGRAINE) 250-250-65 MG tablet Take by mouth every 6 (six) hours as needed for headache.    . nortriptyline (PAMELOR) 10 MG capsule Take 1 capsule (10 mg total) by mouth at bedtime. 30 capsule 6   No current facility-administered medications for this visit.     Allergies as of 05/03/2016 - Review Complete 05/03/2016  Allergen Reaction Noted  . Imitrex [sumatriptan] Shortness Of Breath and Swelling 08/28/2013    Vitals: BP (!) 146/83   Pulse (!) 102   Resp 20   Ht 5\' 3"  (1.6 m)   Wt 177 lb (80.3 kg)   BMI 31.35 kg/m  Last Weight:  Wt Readings from Last 1 Encounters:  05/03/16 177 lb (80.3 kg)   Last Height:   Ht Readings from Last 1 Encounters:  05/03/16 5\' 3"  (1.6 m)    Physical exam: Exam: Gen: NAD, conversant, well nourised, obese, well groomed                     CV: RRR, no MRG. No Carotid Bruits. No peripheral edema, warm, nontender Eyes: Conjunctivae clear without exudates or hemorrhage  Neuro: Detailed Neurologic Exam  Speech:    Speech is normal; fluent and spontaneous with normal comprehension.  Cognition:    The patient is oriented to person, place, and time;     recent and remote memory intact;     language fluent;     normal attention, concentration,     fund of knowledge Cranial Nerves:    The pupils are equal, round, and reactive to light. The fundi are normal and spontaneous venous pulsations  are present. Visual fields are full to finger confrontation. Extraocular movements are intact. Trigeminal sensation is intact and the muscles of mastication are normal. The face is symmetric. The palate elevates in the midline. Hearing intact. Voice is normal. Shoulder shrug is normal. The tongue has normal motion without fasciculations.   Coordination:    Normal finger to nose and heel to shin. Normal rapid alternating movements.   Gait:    Heel-toe and tandem gait are normal.   Motor Observation:    No asymmetry, no atrophy, and no involuntary movements noted. Tone:    Normal muscle tone.    Posture:    Posture is normal. normal erect    Strength:    Strength is V/V in the  upper and lower limbs.      Sensation: intact to LT     Reflex Exam:  DTR's:    Deep tendon reflexes in the upper and lower extremities are normal bilaterally.   Toes:    The toes are downgoing bilaterally.   Clonus:    Clonus is absent.      Assessment/Plan:  31 year old patient with syncopal episodes, headaches, fatigue, impaired peripheral vision with a reported visual field deficit  - Ophthalmology referral for vision changes: Dr. Katy Fitch. 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 Start nortriptyline at night for prevention. - Headache, migraine, syncopal episodes, musculoskeletal neck pain: [facility] for acupuncture, massage, PT and boifeedback - Advised no driving until workup completed - If symptoms acutely worsen proceed to ED directly.  Addendum: Normal exam Dr. Katy Fitch with normal visual fields 05/29/2016 Cc: Dr. Osborne Casco   Sarina Ill, [facility] [address] [place], [place] [address]  Phone  [phone] Fax [phone]

## 2016-05-04 ENCOUNTER — Encounter: Payer: Self-pay | Admitting: Neurology

## 2016-05-04 ENCOUNTER — Telehealth: Payer: Self-pay | Admitting: Neurology

## 2016-05-04 NOTE — Telephone Encounter (Signed)
Signed and given to [facility] for referral.

## 2016-05-04 NOTE — Telephone Encounter (Signed)
Therapy order form completed. Awaiting MD review/signature.

## 2016-05-04 NOTE — Telephone Encounter (Signed)
Sent Referral to [facility]. Fax [phone]. Thanks Hinton Dyer.

## 2016-05-10 ENCOUNTER — Other Ambulatory Visit: Payer: Self-pay | Admitting: Neurology

## 2016-05-10 ENCOUNTER — Encounter: Payer: Self-pay | Admitting: Neurology

## 2016-05-10 DIAGNOSIS — G43009 Migraine without aura, not intractable, without status migrainosus: Secondary | ICD-10-CM

## 2016-05-10 MED ORDER — NORTRIPTYLINE HCL 25 MG PO CAPS: 25.0000 mg | ORAL_CAPSULE | Freq: Every day | ORAL | 3 refills | Status: DC

## 2016-05-11 ENCOUNTER — Ambulatory Visit (INDEPENDENT_AMBULATORY_CARE_PROVIDER_SITE_OTHER): Payer: BLUE CROSS/BLUE SHIELD

## 2016-05-11 DIAGNOSIS — R419 Unspecified symptoms and signs involving cognitive functions and awareness: Secondary | ICD-10-CM

## 2016-05-11 DIAGNOSIS — R55 Syncope and collapse: Secondary | ICD-10-CM | POA: Diagnosis not present

## 2016-05-22 ENCOUNTER — Encounter: Payer: Self-pay | Admitting: Neurology

## 2016-05-22 NOTE — Procedures (Signed)
   [facility]  EEG (ELECTROENCEPHALOGRAM) REPORT   STUDY DATE: 05/11/16 PATIENT NAME: Sierra Stewart DOB: [birth year] MRN: [id number]  ORDERING CLINICIAN: Sarina Ill, MD   TECHNOLOGIST: Oneita Jolly TECHNIQUE: Electroencephalogram was recorded utilizing standard 10-20 system of lead placement and reformatted into average and bipolar montages.  RECORDING TIME: 20 minutes  ACTIVATION: hyperventilation and photic stimulation  CLINICAL INFORMATION: 31 year old female with syncope, headache and vision changes.   FINDINGS: Background rhythms of 10-11 hertz and 60-70 microvolts. No focal, lateralizing, epileptiform activity or seizures are seen. Patient recorded in the awake and drowsy state. EKG channel shows regular rhythm of 90-95 beats per minute.  IMPRESSION:  Normal EEG in the awake and drowsy states.    INTERPRETING PHYSICIAN:  Penni Bombard, MD Certified in Neurology, Neurophysiology and Neuroimaging  [facility] [address] [place], [place] [address] [phone]

## 2016-05-24 NOTE — Telephone Encounter (Signed)
[facility] referral form signed and faxed F # [phone].

## 2016-05-30 ENCOUNTER — Telehealth: Payer: Self-pay

## 2016-05-30 NOTE — Telephone Encounter (Signed)
SENT NOTE TO SCHEDULING 

## 2016-05-31 ENCOUNTER — Telehealth: Payer: Self-pay

## 2016-05-31 NOTE — Telephone Encounter (Signed)
Received faxed office notes from 05/29/16 consult. Per Dr. Zenia Resides notes, "Symptoms sound like migraine. Eyes look normal and healthy. Possible that previous VF tests were abnormal if taken while a migraine was occurring. The VF today is normal. F/u on a prn basis."

## 2016-06-04 ENCOUNTER — Encounter: Payer: Self-pay | Admitting: Neurology

## 2016-06-05 NOTE — Telephone Encounter (Signed)
New patient was first seen 05/03/16 for migraines w/ vision changes and memory loss. She was referred o [facility]. Had EEG which was normal. Opthalmologist reported, "Symptoms sound like migraine. Eyes look normal and healthy." Pt has stopped taking nortriptyline.

## 2016-06-16 ENCOUNTER — Telehealth: Payer: Self-pay

## 2016-06-16 NOTE — Telephone Encounter (Signed)
Received faxed PT progress report from [facility]. Plan for visits 2x/wk x 8 wks.

## 2016-06-22 ENCOUNTER — Telehealth: Payer: Self-pay

## 2016-06-22 NOTE — Telephone Encounter (Signed)
Received faxed ambulatory EEG results w/ physician conclusion/impression: normal. Given to Dr. Jaynee Eagles for review.

## 2016-06-29 ENCOUNTER — Encounter: Payer: Self-pay | Admitting: Neurology

## 2016-06-30 ENCOUNTER — Encounter: Payer: Self-pay | Admitting: *Deleted

## 2016-06-30 NOTE — Telephone Encounter (Signed)
Anderson Malta, please share results. yes

## 2016-07-17 NOTE — Progress Notes (Signed)
Cardiology Office Note   Date:  07/19/2016   ID:  Sierra Stewart, DOB 1985/12/13, MRN [id number]  PCP:  Haywood Pao, MD  Cardiologist:   Jenkins Rouge, MD   Chief Complaint  Patient presents with  . Establish Care  . Palpitations      History of Present Illness: Sierra Stewart is a 31 y.o. female who presents for evaluation/consultation palpitations Referred by Dr Osborne Casco Thinks patient needs an event monitor  History of syncope Seen by neurology 05/03/16 with headaches and visual changes Symptoms for 2 years Initial episode triggerred By diesel smell with loss of bowel control Another episode with rapid palpitations vision with kaleidoscopes on left side. Felt hot again. Extreme fatigue since October. Nausea every day and migraines every week. 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 MRI here measures 11 mm but she is  Planning on surgery mid May in [place]. Thought is her visual symptoms headaches and possibly syncope Is from cyst that may be larger in some scans  TSH .53    Past Medical History:  Diagnosis Date  . GERD (gastroesophageal reflux disease)   . Seasonal allergies   . Uterine anomaly    SEPTUM    Past Surgical History:  Procedure Laterality Date  . HYSTEROSCOPY N/A 09/03/2013   Procedure: HYSTEROSCOPY, EXCISION OF UTRINE SEPTUM, suction d&c, stent placement with septrafilm;  Surgeon: Governor Specking, MD;  Location: [facility];  Service: Gynecology;  Laterality: N/A;  . HYSTEROSCOPY W/D&C  AGE 35  . WISDOM TOOTH EXTRACTION  AGE 38     Current  Outpatient Prescriptions  Medication Sig Dispense Refill  . aspirin-acetaminophen-caffeine (EXCEDRIN MIGRAINE) 250-250-65 MG tablet Take by mouth every 6 (six) hours as needed for headache.     No current facility-administered medications for this visit.     Allergies:   Imitrex [sumatriptan]    Social History:  The patient  reports that she has been smoking Cigarettes.  She has a 15.00 pack-year smoking history. She has never used smokeless tobacco. She reports that she drinks about 2.4 oz of alcohol per week . She reports that she does not use drugs.   Family History:  The patient's family history includes Migraines in her father. HTN in both parents    ROS:  Please see the history of present illness.   Otherwise, review of systems are positive for none.   All other systems are reviewed and negative.    PHYSICAL EXAM: VS:  BP 110/60   Pulse 74   Ht 5' 2.5" (1.588 m)   Wt 177 lb 6.4 oz (80.5 kg)   SpO2 98%   BMI 31.93 kg/m  , BMI Body mass index is 31.93 kg/m. Affect appropriate Healthy:  appears stated age 24: normal Neck supple with no adenopathy JVP normal no bruits no thyromegaly Lungs clear with no wheezing and good diaphragmatic motion Heart:  S1/S2 no murmur, no rub, gallop or click PMI normal Abdomen: benighn, BS positve, no tenderness, no AAA no bruit.  No HSM or HJR Distal pulses intact with no bruits No edema Neuro non-focal Skin warm and dry No muscular weakness    EKG: SR LAD RSR' rate 78  07/19/16  SR rate 76 ICRBBB low voltage    Recent Labs: No results found for requested labs within last 8760 hours.    Lipid Panel No results found for: CHOL, TRIG, HDL, CHOLHDL, VLDL, LDLCALC, LDLDIRECT    Wt Readings from Last 3 Encounters:  07/19/16 177 lb 6.4 oz (80.5 kg)  05/03/16 177 lb (80.3 kg)  09/03/13 156 lb (70.8 kg)      Other studies Reviewed: Additional studies/ records that were reviewed today include: Notes primary MRI and ECG labs  .    ASSESSMENT AND PLAN:  1. Syncope not likely cardiac ? Related to pineal cyst see below 2. Palpitations Normal exam normal ECG will give event monitor to see if she has any arrhythmia May be related to stress, nicotine and phentermine   Ok to proceed with surgery for pineal gland cyst in May    Current medicines are reviewed at length with the patient today.  The patient does not have concerns regarding medicines.  The following changes have been made:  no change  Labs/ tests ordered today include: Event monitor   Orders Placed This Encounter  Procedures  . Cardiac event monitor  . EKG 12-Lead     Disposition:   FU with me PRN      Signed, Jenkins Rouge, MD  07/19/2016 4:56 PM    [facility] [place], [place], [place]  [address] Phone: [phone]; Fax: [phone]

## 2016-07-19 ENCOUNTER — Ambulatory Visit (INDEPENDENT_AMBULATORY_CARE_PROVIDER_SITE_OTHER): Payer: BLUE CROSS/BLUE SHIELD | Admitting: Cardiovascular Disease

## 2016-07-19 ENCOUNTER — Encounter: Payer: Self-pay | Admitting: Cardiovascular Disease

## 2016-07-19 VITALS — BP 110/60 | HR 74 | Ht 62.5 in | Wt 177.4 lb

## 2016-07-19 DIAGNOSIS — R002 Palpitations: Secondary | ICD-10-CM

## 2016-07-19 DIAGNOSIS — Z7689 Persons encountering health services in other specified circumstances: Secondary | ICD-10-CM | POA: Diagnosis not present

## 2016-07-19 NOTE — Patient Instructions (Addendum)
Medication Instructions:  Your physician recommends that you continue on your current medications as directed. Please refer to the Current Medication list given to you today.  Labwork: NONE  Testing/Procedures: Your physician has recommended that you wear an event monitor. Event monitors are medical devices that record the heart's electrical activity. Doctors most often [place] these monitors to diagnose arrhythmias. Arrhythmias are problems with the speed or rhythm of the heartbeat. The monitor is a small, portable device. You can wear one while you do your normal daily activities. This is usually used to diagnose what is causing palpitations/syncope (passing out).  Follow-Up: Your physician wants you to follow-up as needed with Dr. Johnsie Cancel.   If you need a refill on your cardiac medications before your next appointment, please call your pharmacy.

## 2016-07-31 ENCOUNTER — Ambulatory Visit (INDEPENDENT_AMBULATORY_CARE_PROVIDER_SITE_OTHER): Payer: BLUE CROSS/BLUE SHIELD

## 2016-07-31 ENCOUNTER — Ambulatory Visit: Payer: BLUE CROSS/BLUE SHIELD | Admitting: Neurology

## 2016-07-31 ENCOUNTER — Telehealth: Payer: Self-pay

## 2016-07-31 DIAGNOSIS — R002 Palpitations: Secondary | ICD-10-CM

## 2016-07-31 DIAGNOSIS — Z7689 Persons encountering health services in other specified circumstances: Secondary | ICD-10-CM | POA: Diagnosis not present

## 2016-07-31 NOTE — Telephone Encounter (Signed)
Pt no-showed her appt this morning. 

## 2016-08-02 ENCOUNTER — Encounter: Payer: Self-pay | Admitting: Neurology

## 2016-08-16 ENCOUNTER — Telehealth: Payer: Self-pay | Admitting: *Deleted

## 2016-08-16 NOTE — Telephone Encounter (Signed)
error 

## 2016-09-01 ENCOUNTER — Telehealth: Payer: Self-pay | Admitting: Cardiovascular Disease

## 2016-09-01 NOTE — Telephone Encounter (Signed)
New message    Pt returning call to [facility]

## 2016-09-01 NOTE — Telephone Encounter (Signed)
Called patient back with monitor results.

## 2017-07-06 ENCOUNTER — Other Ambulatory Visit: Payer: Self-pay | Admitting: Neurosurgery

## 2017-07-06 DIAGNOSIS — R519 Headache, unspecified: Secondary | ICD-10-CM

## 2017-07-06 DIAGNOSIS — R51 Headache: Principal | ICD-10-CM

## 2018-10-27 IMAGING — MR MR HEAD WO/W CM
10 series · 48 of 48 positions shown · IV contrast (multihance)
Comparison: None.

CLINICAL DATA: Peripheral visual field defect.  Weekly migraines.

EXAM:
MRI HEAD WITHOUT AND WITH CONTRAST
TECHNIQUE: Multiplanar, multiecho pulse sequences of the brain and surrounding
structures were obtained without and with intravenous contrast.
CONTRAST:  17mL MULTIHANCE GADOBENATE DIMEGLUMINE 529 MG/ML IV SOLN

[Series 2: T1 · sagittal · 5.0mm · 0.45mm/px · 2 of 21 slices shown]
[im 1/21]
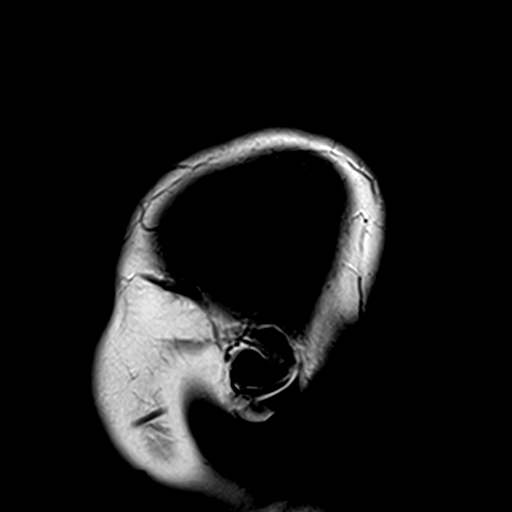
[im 21/21]
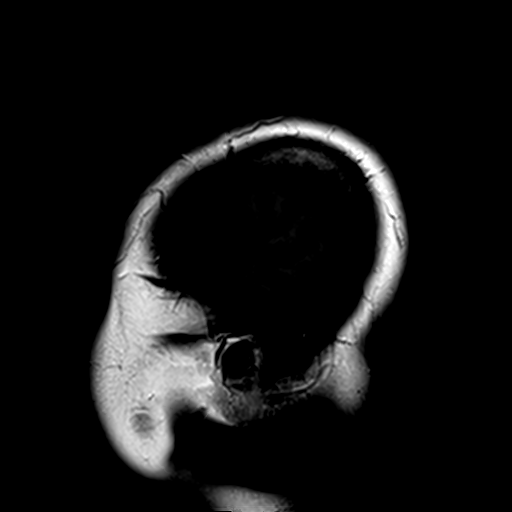

[Series 3: DWI · axial · 3.0mm · 1.80mm/px · z∈[-50,+97]mm · 7 of 100 slices shown (1 of 2)]
[im 1/100]
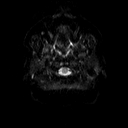
[im 17/100]
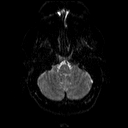
[im 34/100]
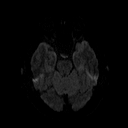
[im 50/100]
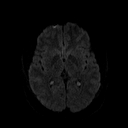
[im 67/100]
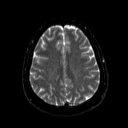
[im 83/100]
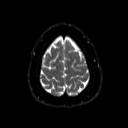
[im 100/100]
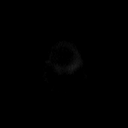

[Series 4: DWI · axial · 3.0mm · 1.80mm/px · z∈[-50,+97]mm · 4 of 50 slices shown (2 of 2)]
[im 1/50]
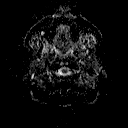
[im 17/50]
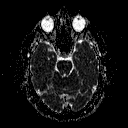
[im 33/50]
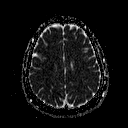
[im 50/50]
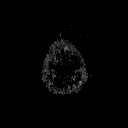

[Series 5: T2 · axial · 5.0mm · 0.51mm/px · z∈[-36,+105]mm · 2 of 22 slices shown (1 of 2)]
[im 1/22]
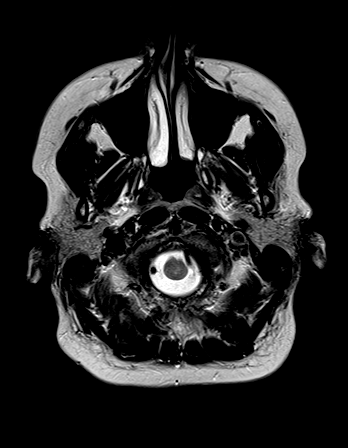
[im 22/22]
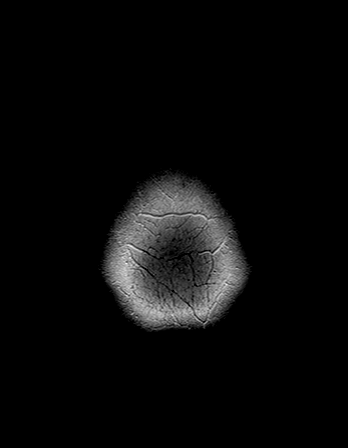

[Series 6: FLAIR · axial · 3.0mm · 0.45mm/px · z∈[-37,+104]mm · 3 of 48 slices shown]
[im 1/48]
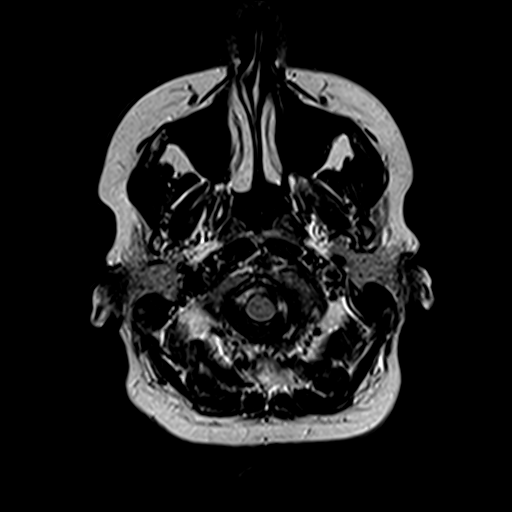
[im 24/48]
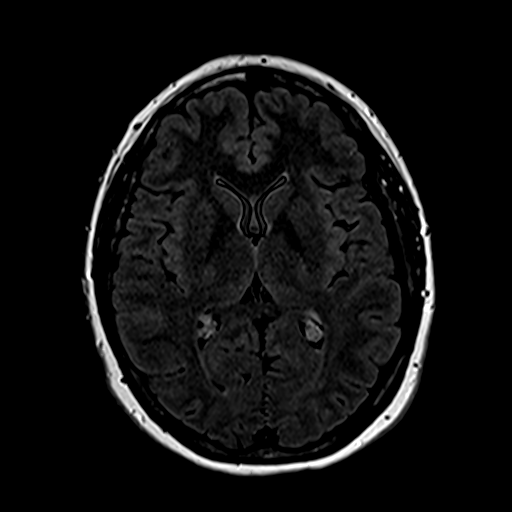
[im 48/48]
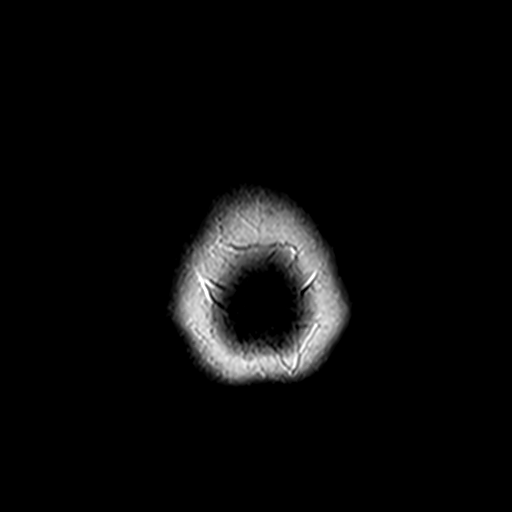

[Series 8: swi_images · axial · 2.0mm · 0.90mm/px · z∈[-45,+112]mm · 6 of 80 slices shown]
[im 1/80]
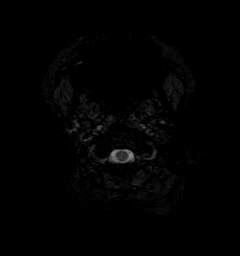
[im 16/80]
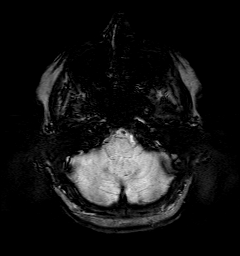
[im 32/80]
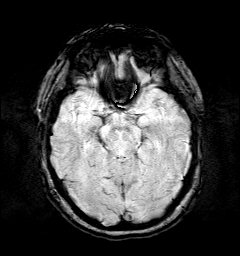
[im 48/80]
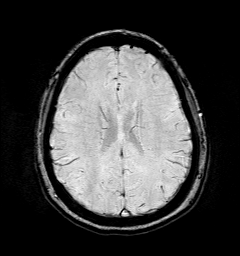
[im 64/80]
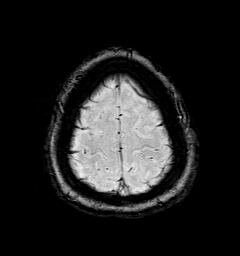
[im 80/80]
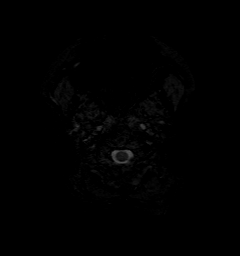

[Series 9: t1_mpr_tra · axial · 1.0mm · 0.75mm/px · z∈[-37,+106]mm · 10 of 144 slices shown (1 of 2)]
[im 1/144]
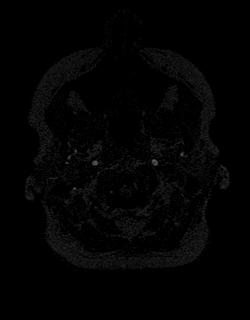
[im 16/144]
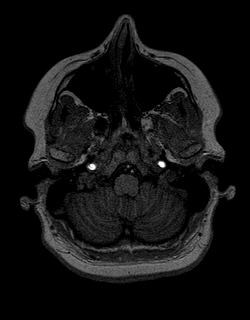
[im 32/144]
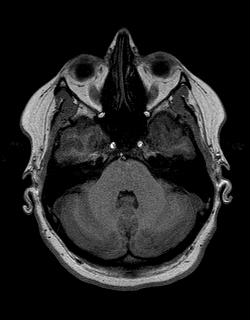
[im 48/144]
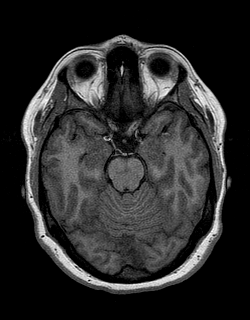
[im 64/144]
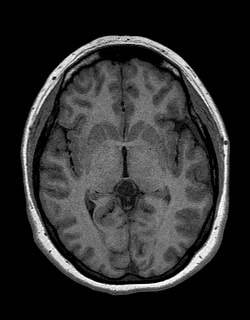
[im 80/144]
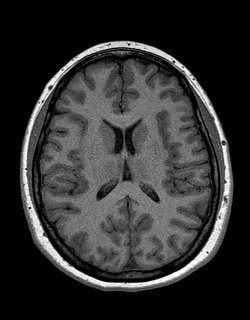
[im 96/144]
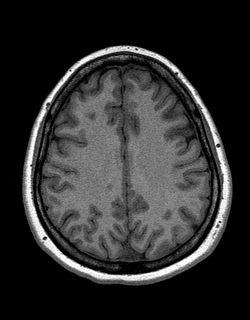
[im 112/144]
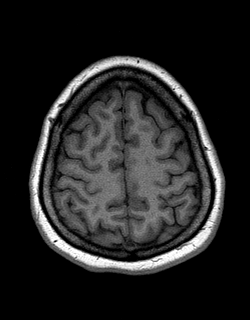
[im 128/144]
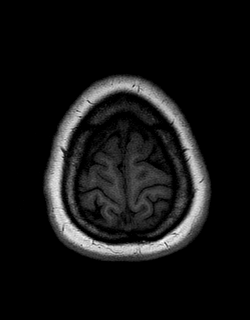
[im 144/144]
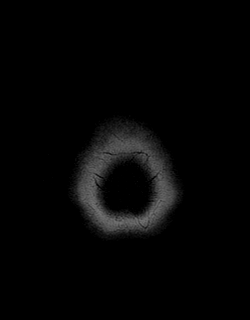

[Series 10: T2 · coronal · 5.0mm · 0.45mm/px · 2 of 25 slices shown (2 of 2)]
[im 1/25]
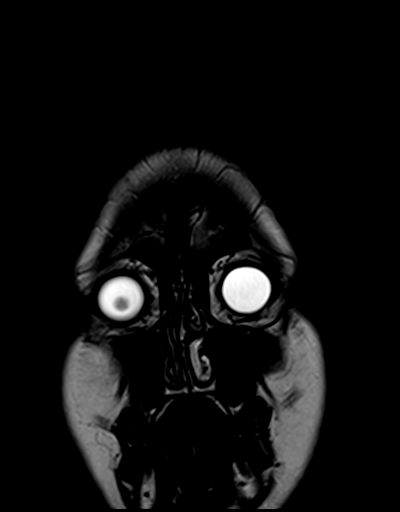
[im 25/25]
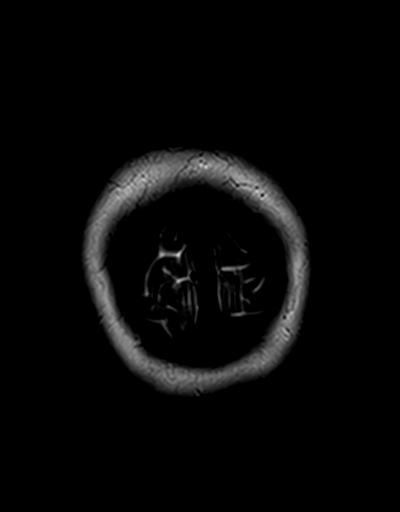

[Series 11: t1_mpr_tra · axial · 1.0mm · 0.75mm/px · z∈[-37,+106]mm · 10 of 144 slices shown (2 of 2)]
[im 1/144]
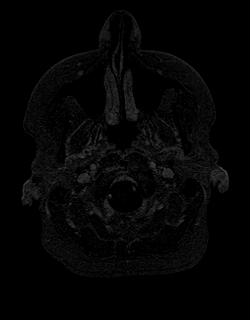
[im 16/144]
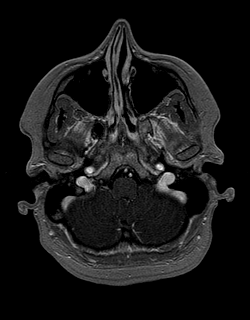
[im 32/144]
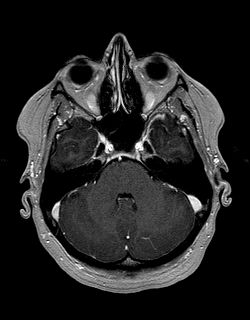
[im 48/144]
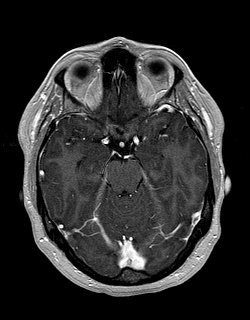
[im 64/144]
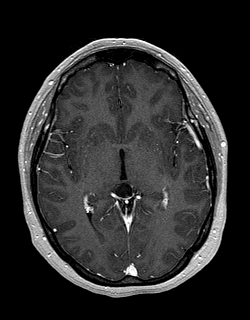
[im 80/144]
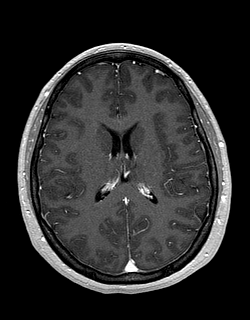
[im 96/144]
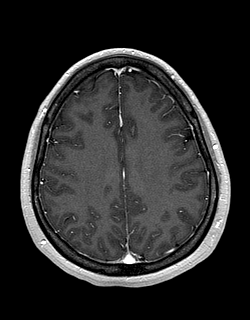
[im 112/144]
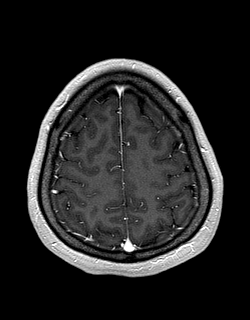
[im 128/144]
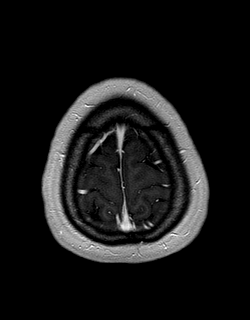
[im 144/144]
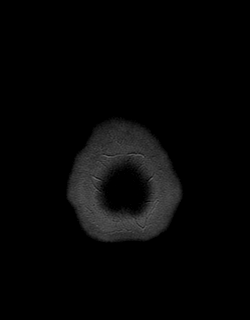

[Series 12: post cor · coronal · 5.0mm · 0.45mm/px · 2 of 25 slices shown]
[im 1/25]
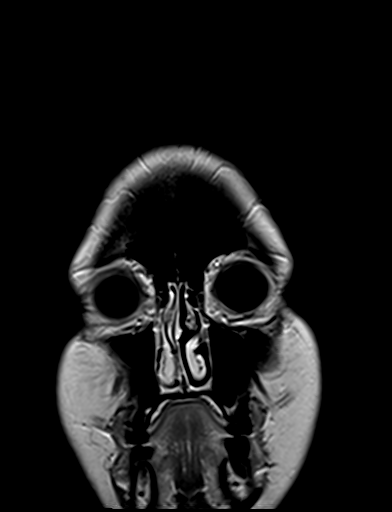
[im 25/25]
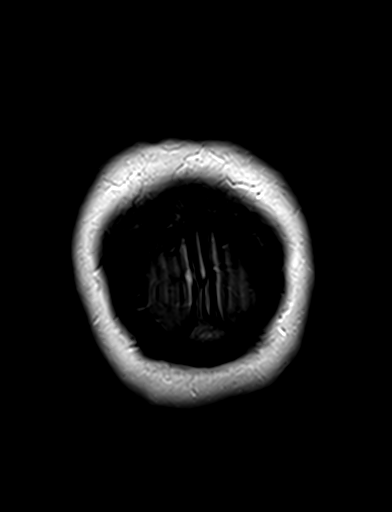

[48 of 48 positions shown; findings below may reference images not displayed]

FINDINGS: Brain: Normal appearance of the chiasm and tracts. Optic pathways
show no mass or gliosis. No acute or remote infarct or hemorrhage.
Simple pineal cyst measuring 11 mm; no hydrocephalus.

Vascular: Negative

Skull and upper cervical spine: Negative

Sinuses/Orbits: Negative
IMPRESSION: 1. No explanation for visual field defect.
2. Simple 11 mm pineal cyst.

## 2020-11-30 ENCOUNTER — Encounter: Payer: Self-pay | Admitting: Neurology

## 2020-12-08 ENCOUNTER — Other Ambulatory Visit: Payer: Self-pay | Admitting: Physician Assistant

## 2020-12-08 DIAGNOSIS — R002 Palpitations: Secondary | ICD-10-CM

## 2020-12-08 DIAGNOSIS — Z9889 Other specified postprocedural states: Secondary | ICD-10-CM

## 2020-12-08 DIAGNOSIS — R55 Syncope and collapse: Secondary | ICD-10-CM

## 2020-12-08 DIAGNOSIS — Z8639 Personal history of other endocrine, nutritional and metabolic disease: Secondary | ICD-10-CM

## 2020-12-23 ENCOUNTER — Ambulatory Visit
Admission: RE | Admit: 2020-12-23 | Discharge: 2020-12-23 | Disposition: A | Discharge status: Home / Self Care | Payer: BLUE CROSS/BLUE SHIELD | Source: Ambulatory Visit | Attending: Physician Assistant | Admitting: Physician Assistant

## 2020-12-23 ENCOUNTER — Other Ambulatory Visit: Payer: Self-pay

## 2020-12-23 DIAGNOSIS — Z8639 Personal history of other endocrine, nutritional and metabolic disease: Secondary | ICD-10-CM

## 2020-12-23 DIAGNOSIS — R55 Syncope and collapse: Secondary | ICD-10-CM

## 2020-12-23 DIAGNOSIS — Z9889 Other specified postprocedural states: Secondary | ICD-10-CM

## 2020-12-23 DIAGNOSIS — R002 Palpitations: Secondary | ICD-10-CM

## 2020-12-23 MED ORDER — GADOBENATE DIMEGLUMINE 529 MG/ML IV SOLN
15.0000 mL | Freq: Once | INTRAVENOUS | Status: AC | PRN
  Administered 2020-12-23: 15 mL via INTRAVENOUS

## 2021-02-22 NOTE — Progress Notes (Signed)
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  It is possible that these spells are symptom of migraine.  However, a cardiac etiology must still be considered, or possibly both migraine and episodes of near syncope may both be symptoms of an underlying dysautonomia.  I do not suspect seizure. MRI findings - benign and incidental.  It may represent any of the possibilities mentioned in the report (chronic microhemorrhage of unknown significance, cavernoma, mineralization)  I would still check CTA of head to evaluate vertebrobasilar system I will have her try Nurtec to take at the earliest onset of the migraine.  Triptans would be contraindicated if these represent basilar migraine. Plan is to start a CGRP inhibitor such as Aimovig, Ajovy or Emgality.  If near-syncopal spells do not improve, then would definitely have her re-evaluated by cardiology or refer to an dysautonomia specialist.  At this time, she would like to hold off on starting a preventative until after CTA Follow up    Subjective:  Sierra Stewart is a 35 year old female with ADHD and history of cranial surgery who presents for pre-syncope.  History supplemented by prior neurologist's and referring provider's notes.  She had been experiencing syncope, headache and vision changes since around 2016.  She was evaluated by neurology in 2018.  Based on notes, there has been variation in semiology.  One time, she felt lightheaded with graying out of vision and then passed out and defecated.  Another episode was described as preceding lightheadedness with palpitations and kaleidoscope vision.  She endorsed persistent peripheral vision loss.   Repeated eye exams reportedly demonstrated variation in visual field cuts.  She started having frequent headaches described as a sharp right temporal pain (also sometimes dull ache, throbbing or pounding) with nausea.  She also endorsed a constant band-like head pressure.  MRI of brain on 04/22/2016 personally reviewed showed 11 mm simple pineal cyst but otherwise unremarkable.  She was evaluated by neurology that year.  Routine EEG on 05/11/2016 was normal.  She had a 24 hour ambulatory EEG the following month which was normal.  She saw ophthalmology on 05/29/16 which demonstrated normal eye exam, including normal visual fields.  She saw cardiology for syncope and palpitations.  Event monitor unremarkable.  She was tried on nortriptyline for migraines, however it was determined that the spells were likely related to the pineal gland cyst  She underwent resection in May 2018.  Spells ceased not long after the surgery.    She then started having a recurrence of spells in early 2022. They subsided for a while and then recurred in July-August 2022.  She starts with an odd sensation. She will develop lightheadedness, partial vision loss in one of her eyes.  She has trouble concentrating and talking.  She then develops a severe right hemicranial pain from the occiput to the right eye as well as sharp right temporal pain and tight-pressure in bandlike distribution around her head.  There is associated nausea, photophobia, phonophobia and eye lacrimation.  Symptoms last 30 to 45 minutes.   Afterwards, she feels fatigued, head feels heavy and generalized weakness.  Usually before one of these episodes, she will have a near-syncopal spell in which she feels like she is going to pass out with  tunnel vision, diaphoresis and sometimes palpitations.  She will be able to sit down which prevents her from passing out and will wait for sensation to pass.  From August to September 2022, she had an episode once a week for 6 weeks.  She  hasn't had another episode until this past Sunday.  She had an MRI of the brain with and without contrast on 12/23/2020, personally reviewed, showed interval left parietooccipital craniotomy without residual or recurrent pineal gland cyst but did demonstarte 5 mm focus of SWI signal loss along the midline callosal splenium, new since prior imaging in 2018, which may reflect chronic microhemorrhage, small cavernoma or focus of mineralization.  She does report history of classic migraines.  Current medications: Excedrin Tension, Tylenol Vyvanse  Past medications: Nortriptyline Depakote   PAST MEDICAL HISTORY: Past Medical History:  Diagnosis Date   GERD (gastroesophageal reflux disease)    Seasonal allergies    Uterine anomaly    SEPTUM    PAST SURGICAL HISTORY: Past Surgical History:  Procedure Laterality Date   HYSTEROSCOPY N/A 09/03/2013   Procedure: HYSTEROSCOPY, EXCISION OF UTRINE SEPTUM, suction d&c, stent placement with septrafilm;  Surgeon: Governor Specking, MD;  Location: [facility];  Service: Gynecology;  Laterality: N/A;   HYSTEROSCOPY WITH D & C  AGE 44   WISDOM TOOTH EXTRACTION  AGE 8    MEDICATIONS: Current Outpatient Medications on File Prior to Visit  Medication Sig Dispense Refill   aspirin-acetaminophen-caffeine (EXCEDRIN MIGRAINE) 250-250-65 MG tablet Take by mouth every 6 (six) hours as needed for headache.     No current facility-administered medications on file prior to visit.    ALLERGIES: Allergies  Allergen Reactions   Imitrex [Sumatriptan] Shortness Of Breath and Swelling    FAMILY HISTORY: Family History  Problem Relation Age of Onset   Migraines Father    Seizures Neg Hx     Objective:  Blood pressure (!) 142/94, pulse 92, height 5\' 2"  (1.575 m), weight 160 lb 6.4 oz (72.8 kg), SpO2 98 %. General: No acute distress.  Patient appears well-groomed.   Head:  Normocephalic/atraumatic Eyes:  fundi examined but not  visualized Neck: supple, no paraspinal tenderness, full range of motion Back: No paraspinal tenderness Heart: regular rate and rhythm Lungs: Clear to auscultation bilaterally. Vascular: No carotid bruits. Neurological Exam: Mental status: alert and oriented to person, place, and time, recent and remote memory intact, fund of knowledge intact, attention and concentration intact, speech fluent and not dysarthric, language intact. Cranial nerves: CN I: not tested CN II: pupils equal, round and reactive to light, visual fields intact CN III, IV, VI:  full range of motion, no nystagmus, no ptosis CN V: facial sensation intact. CN VII: upper and lower face symmetric CN VIII: hearing intact CN IX, X: gag intact, uvula midline CN XI: sternocleidomastoid and trapezius muscles intact CN XII: tongue midline Bulk & Tone: normal, no fasciculations. Motor:  muscle strength 5/5 throughout Sensation:  Pinprick, temperature and vibratory sensation intact. Deep Tendon Reflexes:  2+ throughout,  toes downgoing.   Finger to nose testing:  Without dysmetria.   Heel to shin:  Without dysmetria.   Gait:  Normal station and stride.  Romberg negative.    Thank you for allowing me to take part in the care of this patient.  Metta Clines, DO  CC:  Haywood Pao, MD  Thayer Ohm, [place]

## 2021-02-23 ENCOUNTER — Other Ambulatory Visit: Payer: Self-pay

## 2021-02-23 ENCOUNTER — Encounter: Payer: Self-pay | Admitting: Neurology

## 2021-02-23 ENCOUNTER — Ambulatory Visit: Payer: BC Managed Care – PPO | Admitting: Neurology

## 2021-02-23 VITALS — BP 142/94 | HR 92 | Ht 62.0 in | Wt 160.4 lb

## 2021-02-23 DIAGNOSIS — G450 Vertebro-basilar artery syndrome: Secondary | ICD-10-CM | POA: Diagnosis not present

## 2021-02-23 DIAGNOSIS — G43109 Migraine with aura, not intractable, without status migrainosus: Secondary | ICD-10-CM | POA: Diagnosis not present

## 2021-02-23 DIAGNOSIS — R55 Syncope and collapse: Secondary | ICD-10-CM | POA: Diagnosis not present

## 2021-02-23 NOTE — Patient Instructions (Signed)
At earliest onset of event/headache (such as when you get that feeling), take Nurtec.  Maximum 1 in 24 hours.  If effective, contact me for prescription CTA of head Plan to start a monthly injection for migraine prevention (either Aimovig, Emgality or Ajovy) Follow up 7 months or sooner

## 2021-02-23 NOTE — Progress Notes (Signed)
Medication Samples have been provided to the patient.  Drug name: Nurtec       Strength: 75 mg        Qty: 3   LOT: [id number]  Exp.Date: 05/25  Dosing instructions: as needed  The patient has been instructed regarding the correct time, dose, and frequency of taking this medication, including desired effects and most common side effects.   Venetia Night 10:02 AM 02/23/2021

## 2021-03-25 ENCOUNTER — Ambulatory Visit: Payer: BC Managed Care – PPO

## 2021-03-25 ENCOUNTER — Other Ambulatory Visit: Payer: Self-pay

## 2021-10-04 NOTE — Progress Notes (Deleted)
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  It is possible that these spells are symptom of migraine.  However, a cardiac etiology must still be considered, or possibly both migraine and episodes of near syncope may both be symptoms of an underlying dysautonomia.  I do not suspect seizure. MRI findings - benign and incidental.  It may represent any of the possibilities mentioned in the report (chronic microhemorrhage of unknown significance, cavernoma, mineralization)   I would still check CTA of head to evaluate vertebrobasilar system I will have her try Nurtec to take at the earliest onset of the migraine.  Triptans would be contraindicated if these represent basilar migraine. Plan is to start a CGRP inhibitor such as Aimovig, Ajovy or Emgality.  If near-syncopal spells do not improve, then would definitely have her re-evaluated by cardiology or refer to an dysautonomia specialist.  At this time, she would like to hold off on starting a preventative until after CTA Follow up       Subjective:  Sierra Stewart is a 36 year old female with ADHD and history of cranial surgery who follows up for migraines and recurrent syncope.  UPDATE: ***  Current NSAIDs/analgesics:  Excedrin Tension Current antidepressants:  Vyvanse Current CGRP inhibitor:  Nurtec (rescue)   HISTORY: She had been experiencing syncope, headache and vision changes since around 2016.  She was evaluated by neurology in 2018.  Based on notes, there has been variation in semiology.  One time, she felt lightheaded with graying out of vision and then passed out and defecated.  Another episode was described as preceding lightheadedness with palpitations and kaleidoscope vision.  She endorsed persistent peripheral vision loss.  Repeated eye exams reportedly demonstrated  variation in visual field cuts.  She started having frequent headaches described as a sharp right temporal pain (also sometimes dull ache, throbbing or pounding) with nausea.  She also endorsed a constant band-like head pressure.  MRI of brain on 04/22/2016 showed 11 mm simple pineal cyst but otherwise unremarkable.  She was evaluated by neurology that year.  Routine EEG on 05/11/2016 was normal.  She had a 24 hour ambulatory EEG the following month which was normal.  She saw ophthalmology on 05/29/16 which demonstrated normal eye exam, including normal visual fields.  She saw cardiology for syncope and palpitations.  Event monitor unremarkable.  She was tried on nortriptyline for migraines, however it was determined that the spells were likely related to the pineal gland cyst  She underwent resection in May 2018.  Spells ceased not long after the surgery.     She then started having a recurrence of spells in early 2022. They subsided for a while and then recurred in July-August 2022.  She starts with an odd sensation. She will develop lightheadedness, partial vision loss in one of her eyes.  She has trouble concentrating and talking.  She then develops a severe right hemicranial pain from the occiput to the right eye as well as sharp right temporal pain and tight-pressure in bandlike distribution around her head.  There is associated nausea, photophobia, phonophobia and eye lacrimation.  Symptoms last 30 to 45 minutes.   Afterwards, she feels fatigued, head feels heavy and generalized weakness.  Usually before one of these episodes, she will have a near-syncopal spell in which she feels like she is going to pass out with tunnel  vision, diaphoresis and sometimes palpitations.  She will be able to sit down which prevents her from passing out and will wait for sensation to pass.  From August to September 2022, she had an episode once a week for 6 weeks.  She hasn't had another episode until this past Sunday.  She had an  MRI of the brain with and without contrast on 12/23/2020 showed interval left parietooccipital craniotomy without residual or recurrent pineal gland cyst but did demonstarte 5 mm focus of SWI signal loss along the midline callosal splenium, new since prior imaging in 2018, which may reflect chronic microhemorrhage, small cavernoma or focus of mineralization.   She does report history of classic migraines.    Past medications: Past antidepressants:  Nortriptyline Past antiepileptics:  Depakote  PAST MEDICAL HISTORY: Past Medical History:  Diagnosis Date   GERD (gastroesophageal reflux disease)    Seasonal allergies    Tumor of pineal gland    Uterine anomaly    SEPTUM    MEDICATIONS: Current Outpatient Medications on File Prior to Visit  Medication Sig Dispense Refill   aspirin-acetaminophen-caffeine (EXCEDRIN MIGRAINE) 250-250-65 MG tablet Take by mouth every 6 (six) hours as needed for headache.     VYVANSE 70 MG capsule Take 70 mg by mouth daily.     No current facility-administered medications on file prior to visit.    ALLERGIES: Allergies  Allergen Reactions   Imitrex [Sumatriptan] Shortness Of Breath and Swelling    FAMILY HISTORY: Family History  Problem Relation Age of Onset   Migraines Father    Stroke Maternal Grandfather    Seizures Neg Hx       Objective:  *** General: No acute distress.  Patient appears well-groomed.   Head:  Normocephalic/atraumatic Eyes:  Fundi examined but not visualized Neck: supple, no paraspinal tenderness, full range of motion Heart:  Regular rate and rhythm Lungs:  Clear to auscultation bilaterally Back: No paraspinal tenderness Neurological Exam: alert and oriented to person, place, and time.  Speech fluent and not dysarthric, language intact.  CN II-XII intact. Bulk and tone normal, muscle strength 5/5 throughout.  Sensation to light touch intact.  Deep tendon reflexes 2+ throughout, toes downgoing.  Finger to nose testing  intact.  Gait normal, Romberg negative.   Metta Clines, DO

## 2021-10-05 ENCOUNTER — Encounter: Payer: Self-pay | Admitting: Neurology

## 2021-10-05 ENCOUNTER — Ambulatory Visit: Payer: BC Managed Care – PPO | Admitting: Neurology

## 2021-10-05 DIAGNOSIS — Z029 Encounter for administrative examinations, unspecified: Secondary | ICD-10-CM

## 2022-02-07 ENCOUNTER — Ambulatory Visit (INDEPENDENT_AMBULATORY_CARE_PROVIDER_SITE_OTHER): Payer: BC Managed Care – PPO

## 2022-02-07 ENCOUNTER — Other Ambulatory Visit: Payer: Self-pay | Admitting: Physician Assistant

## 2022-02-07 DIAGNOSIS — G44319 Acute post-traumatic headache, not intractable: Secondary | ICD-10-CM

## 2022-02-07 DIAGNOSIS — H538 Other visual disturbances: Secondary | ICD-10-CM | POA: Diagnosis not present

## 2022-02-07 MED ORDER — GADOBUTROL 1 MMOL/ML IV SOLN
7.0000 mL | Freq: Once | INTRAVENOUS | Status: AC | PRN
  Administered 2022-02-07: 7 mL via INTRAVENOUS

## 2022-02-14 ENCOUNTER — Other Ambulatory Visit: Payer: Self-pay | Admitting: Obstetrics and Gynecology

## 2022-02-14 DIAGNOSIS — R102 Pelvic and perineal pain: Secondary | ICD-10-CM

## 2022-02-28 ENCOUNTER — Other Ambulatory Visit: Payer: BC Managed Care – PPO

## 2022-02-28 ENCOUNTER — Ambulatory Visit
Admission: RE | Admit: 2022-02-28 | Discharge: 2022-02-28 | Disposition: A | Discharge status: Home / Self Care | Payer: BC Managed Care – PPO | Source: Ambulatory Visit | Attending: Obstetrics and Gynecology | Admitting: Obstetrics and Gynecology

## 2022-02-28 DIAGNOSIS — R102 Pelvic and perineal pain: Secondary | ICD-10-CM

## 2022-03-03 ENCOUNTER — Encounter (HOSPITAL_COMMUNITY): Payer: Self-pay

## 2022-03-03 ENCOUNTER — Other Ambulatory Visit: Payer: Self-pay

## 2022-03-03 ENCOUNTER — Emergency Department (HOSPITAL_COMMUNITY)
Admission: EM | Admit: 2022-03-03 | Discharge: 2022-03-03 | Disposition: A | Discharge status: Home / Self Care | Payer: BC Managed Care – PPO | Attending: Emergency Medicine | Admitting: Emergency Medicine

## 2022-03-03 ENCOUNTER — Emergency Department (HOSPITAL_COMMUNITY): Payer: BC Managed Care – PPO

## 2022-03-03 DIAGNOSIS — R079 Chest pain, unspecified: Secondary | ICD-10-CM | POA: Diagnosis present

## 2022-03-03 DIAGNOSIS — Z1152 Encounter for screening for COVID-19: Secondary | ICD-10-CM | POA: Insufficient documentation

## 2022-03-03 LAB — CBC
HCT: 40.3 % (ref 36.0–46.0)
Hemoglobin: 13.6 g/dL (ref 12.0–15.0)
MCH: 31.9 pg (ref 26.0–34.0)
MCHC: 33.7 g/dL (ref 30.0–36.0)
MCV: 94.4 fL (ref 80.0–100.0)
Platelets: 323 K/uL (ref 150–400)
RBC: 4.27 MIL/uL (ref 3.87–5.11)
RDW: 11.6 % (ref 11.5–15.5)
WBC: 7.7 K/uL (ref 4.0–10.5)
nRBC: 0 % (ref 0.0–0.2)

## 2022-03-03 LAB — BASIC METABOLIC PANEL WITH GFR
Anion gap: 9 (ref 5–15)
BUN: 15 mg/dL (ref 6–20)
CO2: 25 mmol/L (ref 22–32)
Calcium: 9.6 mg/dL (ref 8.9–10.3)
Chloride: 104 mmol/L (ref 98–111)
Creatinine, Ser: 0.63 mg/dL (ref 0.44–1.00)
GFR, Estimated: >60 mL/min (ref >60)
Glucose, Bld: 91 mg/dL (ref 70–99)
Potassium: 3.5 mmol/L (ref 3.5–5.1)
Sodium: 138 mmol/L (ref 135–145)

## 2022-03-03 LAB — RESP PANEL BY RT-PCR (FLU A&B, COVID) ARPGX2
Influenza A by PCR: NEGATIVE
Influenza B by PCR: NEGATIVE
SARS Coronavirus 2 by RT PCR: NEGATIVE

## 2022-03-03 LAB — TROPONIN I (HIGH SENSITIVITY): Troponin I (High Sensitivity): 5 ng/L (ref <18)

## 2022-03-03 LAB — I-STAT BETA HCG BLOOD, ED (MC, WL, AP ONLY): I-stat hCG, quantitative: <5 m[IU]/mL (ref <5)

## 2022-03-03 NOTE — ED Provider Notes (Signed)
 [facility] EMERGENCY DEPARTMENT Provider Note   CSN: [id number] Arrival date & time: 03/03/22  1508     History Chief Complaint  Patient presents with   Chest Pain    Sierra Stewart is a 36 y.o. female.   Chest Pain Associated symptoms: no dizziness, no fever, no headache, no nausea, no vomiting and no weakness   Patient reports being woken up this morning by severe tachycardia and chest pain around 2:30 AM.  She states that she was asleep and does not recall having any kind of dream or nightmare that woke her up but reports that she specifically began feeling her heart race followed by increasing chest pressure.  She reports an episode of vomiting and diarrhea followed by nausea throughout the day.  The symptoms have improved during the day.  She reports that chest pain at its worst felt like it was radiating into left axilla as well as left side of neck.  Patient reports that she is currently taking Vyvanse and last took Nurtec ODT about 1 week ago but denies any other current prescription medication use.  Denies headaches, abdominal pain, shortness of breath at rest, lower leg swelling, or syncopal event.   Home Medications Prior to Admission medications   Medication Sig Start Date End Date Taking? Authorizing Provider  acetaminophen (TYLENOL) 500 MG tablet Take 1,000 mg by mouth daily as needed (pain).   Yes [provider]  aspirin-acetaminophen-caffeine (EXCEDRIN MIGRAINE) [id number] MG tablet Take 2 tablets by mouth daily as needed for headache or migraine.   Yes [provider]  Aspirin-Acetaminophen-Caffeine (GOODY HEADACHE PO) Take 1 packet by mouth daily as needed (pain, headache).   Yes [provider]  ibuprofen (ADVIL) 200 MG tablet Take 600 mg by mouth daily as needed for headache (pain).   Yes [provider]  lisdexamfetamine (VYVANSE) 70 MG capsule Take 70 mg by mouth in the morning.   Yes [provider]   naproxen sodium (ANAPROX) 550 MG tablet Take 550 mg by mouth 2 (two) times daily as needed (pain, inflammation).   Yes [provider]      Allergies    Imitrex [sumatriptan]    Review of Systems   Review of Systems  Constitutional:  Negative for chills and fever.  Respiratory:  Positive for chest tightness.   Cardiovascular:  Positive for chest pain.  Gastrointestinal:  Negative for nausea and vomiting.  Neurological:  Negative for dizziness, seizures, syncope, weakness and headaches.  All other systems reviewed and are negative.   Physical Exam Updated Vital Signs BP 121/86 (BP Location: Right Arm)   Pulse 85   Temp 98.4 F (36.9 C) (Oral)   Resp 16   Ht 5\' 2"  (1.575 m)   Wt 67.4 kg   LMP 02/26/2022   SpO2 100%   BMI 27.16 kg/m  Physical Exam Vitals and nursing note reviewed.  Constitutional:      General: She is not in acute distress.    Appearance: She is well-developed and normal weight. She is not ill-appearing.  Cardiovascular:     Rate and Rhythm: Normal rate and regular rhythm.     Heart sounds: Normal heart sounds.  Pulmonary:     Effort: Pulmonary effort is normal. No tachypnea.  Chest:     Chest wall: Tenderness present.     Comments: Tenderness along midclavicular 3rd intercostal space on palpation Musculoskeletal:     Cervical back: Normal range of motion.  Skin:  General: Skin is warm and dry.     Capillary Refill: Capillary refill takes less than 2 seconds.  Neurological:     General: No focal deficit present.     Mental Status: She is alert.  Psychiatric:        Mood and Affect: Mood is anxious.     ED Results / Procedures / Treatments   Labs (all labs ordered are listed, but only abnormal results are displayed) Labs Reviewed  RESP PANEL BY RT-PCR (FLU A&B, COVID) ARPGX2  BASIC METABOLIC PANEL  CBC  TSH  I-STAT BETA HCG BLOOD, ED ([facility], [facility], [facility] ONLY)  TROPONIN I (HIGH SENSITIVITY)  TROPONIN I (HIGH SENSITIVITY)    EKG EKG  Interpretation  Date/Time:  Friday March 03 2022 15:18:55 EST Ventricular Rate:  113 PR Interval:  140 QRS Duration: 96 QT Interval:  324 QTC Calculation: 444 R Axis:   98 Text Interpretation: Sinus tachycardia Rightward axis Incomplete right bundle branch block Borderline ECG No previous ECGs available Confirmed by Eber Hong ([id number]) on 03/03/2022 7:11:39 PM  Radiology DG Chest 1 View  Result Date: 03/03/2022 CLINICAL DATA:  Chest pain. EXAM: CHEST  1 VIEW COMPARISON:  None Available. FINDINGS: The heart size and mediastinal contours are within normal limits. Both lungs are clear. The visualized skeletal structures are unremarkable. IMPRESSION: No active disease. Electronically Signed   By: Lupita Raider M.D.   On: 03/03/2022 16:22    Procedures Procedures   Medications Ordered in ED Medications - No data to display  ED Course/ Medical Decision Making/ A&P                           Medical Decision Making  This patient presents to the ED for concern of chest pain and tachycardia, this involves an extensive number of treatment options, and is a complaint that carries with it a high risk of complications and morbidity.  The differential diagnosis includes tachycardic arrhythmia, ACS, pulmonary embolism, hyperthyroidism, pregnancy   Co morbidities that complicate the patient evaluation  History of tumor of pineal gland   Additional history obtained:  External records from outside source obtained and reviewed including MRI of the brain from February 28, 2022   Lab Tests:  I Ordered, and personally interpreted labs.  The pertinent results include: All labs are reassuring of no cardiac etiology at this time   Imaging Studies ordered:  I ordered imaging studies including chest x-ray I independently visualized and interpreted imaging which showed no signs of cardiopulmonary disease I agree with the radiologist interpretation   Cardiac Monitoring: / EKG:  The  patient was maintained on a cardiac monitor.  I personally viewed and interpreted the cardiac monitored which showed an underlying rhythm of: Sinus tachycardia with ventricular rate at 113 bpm   Problem List / ED Course / Critical interventions / Medication management  Chest pain I have reviewed the patients home medicines and have made adjustments as needed   Test / Admission - Considered:  Patient presented to the ER for 1 day history of chest pain.  She reported that she woke up this morning feeling tachycardic and a cold sweat and then followed up with an episode of nausea and vomiting.  Patient reports that once vomiting occurred nausea reduced but remained consistent throughout the day and is steadily improved.  Patient does not take any medications to help with symptoms but reports that she is longer feeling tachycardic or nauseous.  Patient was advised that although EKG was normal while in ER today, a cardiology consultation to assess for possible cardiac events with cardiac event monitor. Patient verbalized understanding and was agreeable to plan. Patient remained in NSR during entire ER encounter and is safe to discharge home based on combination of normal labs, EKG, and clinical judgement.   Final Clinical Impression(s) / ED Diagnoses Final diagnoses:  Chest pain, unspecified type    Rx / DC Orders ED Discharge Orders          Ordered    Ambulatory referral to Cardiology       Comments: If you have not heard from the Cardiology office within the next 72 hours please call [phone].   03/03/22 2018              Smitty Knudsen, PA-C 03/03/22 2137    Eber Hong, MD 03/04/22 1224
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
## 2022-03-03 NOTE — ED Triage Notes (Signed)
Pt presents with CP that woke her from sleep at 230 this morning.  Pt had one episode of vomiting and diarrhea at that time.  Pt reports being evaluated for similar episode by her PCP however it was not as severe as today's.  Pt continues to be nauseas and have pain in her chest at this time.

## 2022-03-03 NOTE — Discharge Instructions (Addendum)
You were seen in the ER today for chest pain.  Based on all lab work, EKGs, and imaging, findings were reassuring for no evidence of a heart attack or heart strain at this time.  Based on the difficult to predict or anticipate nature of the periods of time where your heart is racing, I recommend outpatient follow-up with cardiology for a cardiac monitor in an effort to try to capture one of these events.  Continue taking all home medications as prescribed.  Return to the ER if chest pain or racing heart becomes persistent and severe or you begin to experience trouble breathing for further treatment and evaluation.

## 2022-03-04 LAB — TROPONIN I (HIGH SENSITIVITY): Troponin I (High Sensitivity): 5 ng/L (ref <18)

## 2022-03-04 LAB — TSH: TSH: 1.038 u[IU]/mL (ref 0.350–4.500)

## 2022-04-06 ENCOUNTER — Encounter: Payer: Self-pay | Admitting: Cardiology

## 2022-04-06 ENCOUNTER — Ambulatory Visit: Payer: BC Managed Care – PPO | Attending: Cardiology | Admitting: Cardiology

## 2022-04-06 VITALS — BP 124/88 | HR 97 | Ht 62.5 in | Wt 147.6 lb

## 2022-04-06 DIAGNOSIS — R002 Palpitations: Secondary | ICD-10-CM

## 2022-04-06 DIAGNOSIS — I493 Ventricular premature depolarization: Secondary | ICD-10-CM

## 2022-04-06 DIAGNOSIS — R079 Chest pain, unspecified: Secondary | ICD-10-CM | POA: Diagnosis not present

## 2022-04-06 MED ORDER — METOPROLOL TARTRATE 25 MG PO TABS: 25.0000 mg | ORAL_TABLET | Freq: Every day | ORAL | 3 refills | Status: AC | PRN

## 2022-04-06 NOTE — Patient Instructions (Signed)
Medication Instructions:  You may take Metoprolol Tartrate 25 mg up to four times a day as needed for rapid heart rate. Continue all other medications as listed.  *If you need a refill on your cardiac medications before your next appointment, please call your pharmacy*   Follow-Up: At [facility], you and your health needs are our priority.  As part of our continuing mission to provide you with exceptional heart care, we have created designated Provider Care Teams.  These Care Teams include your primary Cardiologist (physician) and Advanced Practice Providers (APPs -  Physician Assistants and Nurse Practitioners) who all work together to provide you with the care you need, when you need it.  We recommend signing up for the patient portal called "MyChart".  Sign up information is provided on this After Visit Summary.  MyChart is used to connect with patients for Virtual Visits (Telemedicine).  Patients are able to view lab/test results, encounter notes, upcoming appointments, etc.  Non-urgent messages can be sent to your provider as well.   To learn more about what you can do with MyChart, go to [email or web address].    Your next appointment:   Follow up as needed with Dr Marlou Porch
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
## 2022-12-12 ENCOUNTER — Other Ambulatory Visit (HOSPITAL_BASED_OUTPATIENT_CLINIC_OR_DEPARTMENT_OTHER): Payer: Self-pay

## 2022-12-12 MED ORDER — LISDEXAMFETAMINE DIMESYLATE 70 MG PO CAPS
ORAL_CAPSULE | ORAL | 0 refills | Status: DC
  Filled 2022-12-12: qty 30, 30d supply, fill #0

## 2023-01-10 ENCOUNTER — Other Ambulatory Visit (HOSPITAL_BASED_OUTPATIENT_CLINIC_OR_DEPARTMENT_OTHER): Payer: Self-pay

## 2023-01-10 MED ORDER — LISDEXAMFETAMINE DIMESYLATE 70 MG PO CAPS
70.0000 mg | ORAL_CAPSULE | Freq: Every day | ORAL | 0 refills | Status: DC
  Filled 2023-01-10: qty 30, 30d supply, fill #0

## 2023-02-09 ENCOUNTER — Other Ambulatory Visit (HOSPITAL_BASED_OUTPATIENT_CLINIC_OR_DEPARTMENT_OTHER): Payer: Self-pay

## 2023-02-09 MED ORDER — LISDEXAMFETAMINE DIMESYLATE 70 MG PO CAPS
70.0000 mg | ORAL_CAPSULE | Freq: Every day | ORAL | 0 refills | Status: DC
  Filled 2023-02-09: qty 30, 30d supply, fill #0

## 2023-03-13 ENCOUNTER — Other Ambulatory Visit (HOSPITAL_BASED_OUTPATIENT_CLINIC_OR_DEPARTMENT_OTHER): Payer: Self-pay

## 2023-03-13 MED ORDER — LISDEXAMFETAMINE DIMESYLATE 70 MG PO CAPS
70.0000 mg | ORAL_CAPSULE | Freq: Every day | ORAL | 0 refills | Status: AC
  Filled 2023-03-13: qty 30, 30d supply, fill #0

## 2023-03-23 ENCOUNTER — Other Ambulatory Visit (HOSPITAL_BASED_OUTPATIENT_CLINIC_OR_DEPARTMENT_OTHER): Payer: Self-pay

## 2023-05-21 ENCOUNTER — Telehealth: Payer: Self-pay

## 2023-05-21 NOTE — Telephone Encounter (Signed)
 Attempted wellness/follow up call to [facility] client no answer, left message requesting return call.  Last seen at [facility] on 10/10/22 no future appointments showing at this time.   Francee Nodal RN [employer]

## 2023-12-12 ENCOUNTER — Encounter (HOSPITAL_BASED_OUTPATIENT_CLINIC_OR_DEPARTMENT_OTHER): Payer: Self-pay | Admitting: Emergency Medicine

## 2023-12-12 ENCOUNTER — Other Ambulatory Visit: Payer: Self-pay

## 2023-12-12 ENCOUNTER — Emergency Department (HOSPITAL_BASED_OUTPATIENT_CLINIC_OR_DEPARTMENT_OTHER)
Admission: EM | Admit: 2023-12-12 | Discharge: 2023-12-12 | Discharge status: Against Medical Advice | Payer: Self-pay | Attending: Emergency Medicine | Admitting: Emergency Medicine

## 2023-12-12 DIAGNOSIS — Z5321 Procedure and treatment not carried out due to patient leaving prior to being seen by health care provider: Secondary | ICD-10-CM | POA: Insufficient documentation

## 2023-12-12 DIAGNOSIS — N939 Abnormal uterine and vaginal bleeding, unspecified: Secondary | ICD-10-CM | POA: Insufficient documentation

## 2023-12-12 LAB — COMPREHENSIVE METABOLIC PANEL WITH GFR
ALT: 8 U/L (ref 0–44)
AST: 12 U/L — ABNORMAL LOW (ref 15–41)
Albumin: 4.5 g/dL (ref 3.5–5.0)
Alkaline Phosphatase: 48 U/L (ref 38–126)
Anion gap: 9 (ref 5–15)
BUN: 7 mg/dL (ref 6–20)
CO2: 25 mmol/L (ref 22–32)
Calcium: 9.3 mg/dL (ref 8.9–10.3)
Chloride: 104 mmol/L (ref 98–111)
Creatinine, Ser: 0.59 mg/dL (ref 0.44–1.00)
GFR, Estimated: >60 mL/min (ref >60)
Glucose, Bld: 90 mg/dL (ref 70–99)
Potassium: 3.8 mmol/L (ref 3.5–5.1)
Sodium: 138 mmol/L (ref 135–145)
Total Bilirubin: 0.5 mg/dL (ref 0.0–1.2)
Total Protein: 6.7 g/dL (ref 6.5–8.1)

## 2023-12-12 LAB — CBC WITH DIFFERENTIAL/PLATELET
Abs Immature Granulocytes: 0.02 K/uL (ref 0.00–0.07)
Basophils Absolute: 0 K/uL (ref 0.0–0.1)
Basophils Relative: 1 %
Eosinophils Absolute: 0.1 K/uL (ref 0.0–0.5)
Eosinophils Relative: 1 %
HCT: 35.2 % — ABNORMAL LOW (ref 36.0–46.0)
Hemoglobin: 11.8 g/dL — ABNORMAL LOW (ref 12.0–15.0)
Immature Granulocytes: 0 %
Lymphocytes Relative: 42 %
Lymphs Abs: 2.7 K/uL (ref 0.7–4.0)
MCH: 29.4 pg (ref 26.0–34.0)
MCHC: 33.5 g/dL (ref 30.0–36.0)
MCV: 87.6 fL (ref 80.0–100.0)
Monocytes Absolute: 0.4 K/uL (ref 0.1–1.0)
Monocytes Relative: 6 %
Neutro Abs: 3.2 K/uL (ref 1.7–7.7)
Neutrophils Relative %: 50 %
Platelets: 276 K/uL (ref 150–400)
RBC: 4.02 MIL/uL (ref 3.87–5.11)
RDW: 12.1 % (ref 11.5–15.5)
WBC: 6.3 K/uL (ref 4.0–10.5)
nRBC: 0 % (ref 0.0–0.2)

## 2023-12-12 LAB — HCG, SERUM, QUALITATIVE: Preg, Serum: NEGATIVE

## 2023-12-12 LAB — PROTIME-INR
INR: 1 (ref 0.8–1.2)
Prothrombin Time: 13.4 s (ref 11.4–15.2)

## 2023-12-12 MED ORDER — IBUPROFEN 400 MG PO TABS
600.0000 mg | ORAL_TABLET | Freq: Once | ORAL | Status: AC
  Administered 2023-12-12: 600 mg via ORAL
  Filled 2023-12-12: qty 1

## 2023-12-12 NOTE — ED Notes (Signed)
 No answer

## 2023-12-12 NOTE — ED Triage Notes (Signed)
 Pt reports starting her normal period on Mon and then increased bleeding today, saturating a super tampon every 15-30 min but it has slowed down, hx of fibroid tumors

## 2023-12-12 NOTE — ED Notes (Signed)
Second call no answer

## 2023-12-12 NOTE — ED Provider Triage Note (Signed)
 Emergency Medicine Provider Triage Evaluation Note  Sierra Stewart , a 38 y.o. female  was evaluated in triage.  Pt complains of vag bleeding.  Review of Systems  Positive: Pelvic pain, excessive bleeding Negative:   Physical Exam  BP 113/84 (BP Location: Right Arm)   Pulse 91   Temp 98.6 F (37 C) (Oral)   Resp 18   Ht 5' 2 (1.575 m)   Wt 66.7 kg   SpO2 100%   BMI 26.89 kg/m  Gen:   Awake, no distress   Resp:  Normal effort  MSK:   Moves extremities without difficulty  Other:  No conjunctival pallor  Medical Decision Making  Medically screening exam initiated at 4:44 PM.  Appropriate orders placed.  CAELI LINEHAN was informed that the remainder of the evaluation will be completed by another provider, this initial triage assessment does not replace that evaluation, and the importance of remaining in the ED until their evaluation is complete.  Vaginal bleeding (nml period) x 2 days, heavy gushing with excessive bleeding today, lightheaded, no syncope, has pelvic pain. Remote history of uterine fibroids. Denies chance of pregnancy.   Odell Balls, PA-C 12/12/23 1646

## 2023-12-13 ENCOUNTER — Encounter (HOSPITAL_BASED_OUTPATIENT_CLINIC_OR_DEPARTMENT_OTHER): Payer: Self-pay | Admitting: Emergency Medicine

## 2023-12-17 NOTE — ED Provider Notes (Signed)
 Triage note endorses patient has been having heavy bleeding for past 2 days.  Vital signs hemodynamically stable with no tachycardia nor hypotension.  Review of results shows hemoglobin 11.8 and negative pregnancy.  Appears to be stable based on lab work, vital signs  Patient eloped from emergency department prior to being called back to ED room.  Was not assessed by EDP   Minnie Tinnie BRAVO, PA 12/17/23 1619    Gennaro Duwaine CROME, DO 12/23/23 [phone]
